# Patient Record
Sex: Female | Born: 1980 | Race: White | Hispanic: No | Marital: Married | Smoking: Current every day smoker
Health system: Southern US, Community
[De-identification: ages and names within clinical notes are randomized; demographics above are authoritative.]

## PROBLEM LIST (undated history)

## (undated) DIAGNOSIS — Z87442 Personal history of urinary calculi: Secondary | ICD-10-CM

## (undated) DIAGNOSIS — Z8619 Personal history of other infectious and parasitic diseases: Secondary | ICD-10-CM

## (undated) HISTORY — DX: Personal history of other infectious and parasitic diseases: Z86.19

## (undated) HISTORY — DX: Personal history of urinary calculi: Z87.442

## (undated) HISTORY — PX: LITHOTRIPSY: SUR834

---

## 2004-09-01 ENCOUNTER — Encounter: Payer: Self-pay | Admitting: General Practice

## 2009-03-28 ENCOUNTER — Encounter: Payer: Self-pay | Admitting: General Practice

## 2012-01-11 HISTORY — PX: TOTAL ABDOMINAL HYSTERECTOMY W/ BILATERAL SALPINGOOPHORECTOMY: SHX83

## 2012-02-22 ENCOUNTER — Encounter: Payer: Self-pay | Admitting: General Practice

## 2012-04-17 ENCOUNTER — Encounter: Payer: Self-pay | Admitting: General Practice

## 2012-05-23 ENCOUNTER — Encounter: Payer: Self-pay | Admitting: General Practice

## 2012-05-28 ENCOUNTER — Encounter: Payer: Self-pay | Admitting: General Practice

## 2013-01-10 HISTORY — PX: ABDOMINAL HYSTERECTOMY: SHX81

## 2013-04-03 ENCOUNTER — Encounter: Payer: Self-pay | Admitting: General Practice

## 2015-02-02 ENCOUNTER — Encounter: Payer: Self-pay | Admitting: General Practice

## 2016-11-29 ENCOUNTER — Encounter: Payer: Self-pay | Admitting: General Practice

## 2017-01-27 ENCOUNTER — Encounter: Payer: Self-pay | Admitting: General Practice

## 2018-11-08 ENCOUNTER — Other Ambulatory Visit: Payer: Self-pay

## 2018-11-08 ENCOUNTER — Ambulatory Visit (INDEPENDENT_AMBULATORY_CARE_PROVIDER_SITE_OTHER): Admitting: Physician Assistant

## 2018-11-08 ENCOUNTER — Encounter: Payer: Self-pay | Admitting: Physician Assistant

## 2018-11-08 VITALS — BP 104/70 | HR 78 | Temp 98.6°F | Resp 14 | Ht 63.0 in | Wt 159.0 lb

## 2018-11-08 DIAGNOSIS — F419 Anxiety disorder, unspecified: Secondary | ICD-10-CM | POA: Insufficient documentation

## 2018-11-08 DIAGNOSIS — M545 Low back pain, unspecified: Secondary | ICD-10-CM

## 2018-11-08 DIAGNOSIS — Z23 Encounter for immunization: Secondary | ICD-10-CM | POA: Diagnosis not present

## 2018-11-08 DIAGNOSIS — F411 Generalized anxiety disorder: Secondary | ICD-10-CM

## 2018-11-08 DIAGNOSIS — Z0001 Encounter for general adult medical examination with abnormal findings: Secondary | ICD-10-CM | POA: Diagnosis not present

## 2018-11-08 DIAGNOSIS — Z0000 Encounter for general adult medical examination without abnormal findings: Secondary | ICD-10-CM

## 2018-11-08 DIAGNOSIS — M797 Fibromyalgia: Secondary | ICD-10-CM

## 2018-11-08 LAB — CBC WITH DIFFERENTIAL/PLATELET
Basophils Absolute: 0.2 10*3/uL — ABNORMAL HIGH (ref 0.0–0.1)
Basophils Relative: 1.1 % (ref 0.0–3.0)
Eosinophils Absolute: 0.3 10*3/uL (ref 0.0–0.7)
Eosinophils Relative: 1.8 % (ref 0.0–5.0)
HCT: 45.6 % (ref 36.0–46.0)
Hemoglobin: 15.1 g/dL — ABNORMAL HIGH (ref 12.0–15.0)
Lymphocytes Relative: 34.7 % (ref 12.0–46.0)
Lymphs Abs: 5.4 10*3/uL — ABNORMAL HIGH (ref 0.7–4.0)
MCHC: 33.2 g/dL (ref 30.0–36.0)
MCV: 93.8 fl (ref 78.0–100.0)
Monocytes Absolute: 1.1 10*3/uL — ABNORMAL HIGH (ref 0.1–1.0)
Monocytes Relative: 7.3 % (ref 3.0–12.0)
Neutro Abs: 8.6 10*3/uL — ABNORMAL HIGH (ref 1.4–7.7)
Neutrophils Relative %: 55.1 % (ref 43.0–77.0)
Platelets: 292 10*3/uL (ref 150.0–400.0)
RBC: 4.86 Mil/uL (ref 3.87–5.11)
RDW: 13.4 % (ref 11.5–15.5)
WBC: 15.6 10*3/uL — ABNORMAL HIGH (ref 4.0–10.5)

## 2018-11-08 LAB — COMPREHENSIVE METABOLIC PANEL
ALT: 25 U/L (ref 0–35)
AST: 15 U/L (ref 0–37)
Albumin: 4.4 g/dL (ref 3.5–5.2)
Alkaline Phosphatase: 116 U/L (ref 39–117)
BUN: 12 mg/dL (ref 6–23)
CO2: 25 mEq/L (ref 19–32)
Calcium: 9.2 mg/dL (ref 8.4–10.5)
Chloride: 105 mEq/L (ref 96–112)
Creatinine, Ser: 0.67 mg/dL (ref 0.40–1.20)
GFR: 99.3 mL/min (ref >60.00)
Glucose, Bld: 78 mg/dL (ref 70–99)
Potassium: 4.2 mEq/L (ref 3.5–5.1)
Sodium: 139 mEq/L (ref 135–145)
Total Bilirubin: 0.4 mg/dL (ref 0.2–1.2)
Total Protein: 7 g/dL (ref 6.0–8.3)

## 2018-11-08 LAB — LIPID PANEL
Cholesterol: 228 mg/dL — ABNORMAL HIGH (ref 0–200)
HDL: 48.7 mg/dL (ref >39.00)
NonHDL: 179.6
Total CHOL/HDL Ratio: 5
Triglycerides: 333 mg/dL — ABNORMAL HIGH (ref 0.0–149.0)
VLDL: 66.6 mg/dL — ABNORMAL HIGH (ref 0.0–40.0)

## 2018-11-08 LAB — LDL CHOLESTEROL, DIRECT: Direct LDL: 140 mg/dL

## 2018-11-08 LAB — HEMOGLOBIN A1C: Hgb A1c MFr Bld: 5.6 % (ref 4.6–6.5)

## 2018-11-08 MED ORDER — CYCLOBENZAPRINE HCL 10 MG PO TABS: 10.0000 mg | ORAL_TABLET | Freq: Every day | ORAL | 0 refills | Status: DC

## 2018-11-08 MED ORDER — GABAPENTIN 300 MG PO CAPS: ORAL_CAPSULE | ORAL | 3 refills | Status: DC

## 2018-11-08 MED ORDER — ESTRADIOL 0.1 MG/GM VA CREA: TOPICAL_CREAM | VAGINAL | 1 refills | Status: DC

## 2018-11-08 NOTE — Progress Notes (Signed)
Patient presents to clinic today to establish care.  Acute Concerns: Patient endorses bilateral low back pain over the past few days after doing some heavy lifting while completing yard work at home. Notes pain worse with bending and other ROM. Denies radiation of pain. Has used heating pad and an old Flexeril with some improvement in symptoms.   Chronic Issues: GAD -- Followed by Psychiatry. Is currently on a regimen of Lexapro and Diazepam. Endorses taking as directed and overall doing very well. Has follow-up every 2 months at present.   Fibromyalgia -- Currently on a regimen of Gabapentin 600-900 mg nightly. Notes significant improvement/control in symptoms with this regimen. Is in need of refills.   Tobacco Use Disorder -- 1 ppd smoker. Wants to quit but is not ready at present. Denies diagnosis of COPD. Denies cough, chest tightness or wheezing.   Health Maintenance: Immunizations -- Agrees to flu shot today. Will obtain records to review prior immunizations.  PAP -- s/p hysterectomy and bilateral salpingectomy and oophorectomy  Past Medical History:  Diagnosis Date  . History of chickenpox     Past Surgical History:  Procedure Laterality Date  . ABDOMINAL HYSTERECTOMY  2015   No ovaries, no uterus  . CESAREAN SECTION  2014  . TOTAL ABDOMINAL HYSTERECTOMY W/ BILATERAL SALPINGOOPHORECTOMY  2014    Current Outpatient Medications on File Prior to Visit  Medication Sig Dispense Refill  . diazepam (VALIUM) 5 MG tablet Take 1-2 tablets by mouth twice daily as needed    . diphenhydrAMINE (BENADRYL) 25 MG tablet Take 1-2 tablets at bedtime for sleep    . escitalopram (LEXAPRO) 5 MG tablet Take 5 mg by mouth every morning.    . gabapentin (NEURONTIN) 300 MG capsule Take 300 mg by mouth. Take 2-3 capsules daily at bedtime    . Multiple Vitamin (MULTIVITAMIN) tablet Take 1 tablet by mouth daily.     No current facility-administered medications on file prior to visit.     No  Known Allergies  Family History  Problem Relation Age of Onset  . Cancer Mother 59       Lung  . COPD Mother   . Depression Mother   . Drug abuse Mother   . Early death Mother   . Heart disease Mother   . Pulmonary embolism Mother   . Cancer Father        Kidney  . Hyperlipidemia Father   . Hypertension Father   . Miscarriages / Stillbirths Sister   . Asthma Brother   . Alcohol abuse Maternal Grandmother   . Alcohol abuse Maternal Grandfather   . Cancer Paternal Grandmother        Breast  . COPD Paternal Grandmother   . Miscarriages / Stillbirths Paternal Grandmother   . Depression Paternal Grandfather   . Heart disease Paternal Grandfather   . Heart attack Paternal Grandfather   . Hypertension Paternal Grandfather   . Hearing loss Paternal Grandfather     Social History   Socioeconomic History  . Marital status: Married    Spouse name: Not on file  . Number of children: Not on file  . Years of education: Not on file  . Highest education level: Not on file  Occupational History  . Not on file  Social Needs  . Financial resource strain: Not on file  . Food insecurity    Worry: Not on file    Inability: Not on file  . Transportation needs    Medical: Not  on file    Non-medical: Not on file  Tobacco Use  . Smoking status: Current Every Day Smoker    Packs/day: 1.00    Types: Cigarettes  . Smokeless tobacco: Never Used  Substance and Sexual Activity  . Alcohol use: Not Currently  . Drug use: Not Currently  . Sexual activity: Yes    Birth control/protection: Surgical  Lifestyle  . Physical activity    Days per week: Not on file    Minutes per session: Not on file  . Stress: Not on file  Relationships  . Social Herbalist on phone: Not on file    Gets together: Not on file    Attends religious service: Not on file    Active member of club or organization: Not on file    Attends meetings of clubs or organizations: Not on file    Relationship  status: Not on file  . Intimate partner violence    Fear of current or ex partner: Not on file    Emotionally abused: Not on file    Physically abused: Not on file    Forced sexual activity: Not on file  Other Topics Concern  . Not on file  Social History Narrative  . Not on file   Review of Systems  Constitutional: Negative for fever.  Eyes: Negative for blurred vision and double vision.  Respiratory: Negative for shortness of breath.   Cardiovascular: Negative for chest pain and palpitations.  Gastrointestinal: Negative for abdominal pain, heartburn, nausea and vomiting.  Genitourinary: Negative for dysuria, frequency and urgency.  Musculoskeletal: Negative for myalgias (with chronic fibromyalgia -- no current flare.).  Psychiatric/Behavioral: Negative for depression, hallucinations, substance abuse and suicidal ideas. The patient is nervous/anxious. The patient does not have insomnia.    Resp 14   Ht 5\' 3"  (1.6 m)   Wt 159 lb (72.1 kg)   BMI 28.17 kg/m   Physical Exam Vitals signs reviewed.  Constitutional:      Appearance: Normal appearance.  HENT:     Head: Normocephalic and atraumatic.     Right Ear: Tympanic membrane normal.     Left Ear: Tympanic membrane normal.     Nose: Nose normal.  Neck:     Musculoskeletal: Neck supple.  Cardiovascular:     Rate and Rhythm: Normal rate and regular rhythm.     Pulses: Normal pulses.     Heart sounds: Normal heart sounds.  Pulmonary:     Effort: Pulmonary effort is normal.     Breath sounds: Normal breath sounds.  Musculoskeletal:     Lumbar back: She exhibits tenderness (bilateral lumbar perispinous musculature. Pain with lateral bending, flexion) and spasm. She exhibits no bony tenderness and no pain.  Neurological:     General: No focal deficit present.     Mental Status: She is alert and oriented to person, place, and time.    Assessment/Plan: 1. Visit for preventive health examination Depression screen negative.  Health Maintenance reviewed -- Flu shot today. No need for pap giving hysterectomy.  Preventive schedule discussed and handout given in AVS. Will obtain fasting labs today.  - CBC with Differential/Platelet - Comprehensive metabolic panel - TSH - Lipid panel - Hemoglobin A1c - B12 - Vitamin D (25 hydroxy)  2. Generalized anxiety disorder Continue management per Psychiatry. She has list of labs requested by specialist. Will order today and fax to her specialist.  - TSH - B12 - Vitamin D (25 hydroxy)  3. Fibromyalgia  Stable. Continue current regimen. Gabapentin refilled.   4. Acute bilateral low back pain without sciatica Mild. No alarm signs/symptoms. Rx Flexeril 10 mg to use at night time. Avoid heavy lifting or overexertion. Heating pad recommended. Alternate Tylenol and Ibuprofen if needed. Follow-up if symptoms are not continuing to resolve.   5. Need for immunization against influenza - Flu Vaccine QUAD 36+ mos IM    Leeanne Rio, PA-C

## 2018-11-08 NOTE — Assessment & Plan Note (Signed)
On regimen of Gabapentin 900 mg at night which help with this considerably. Continue current regimen. Will take over refills for her.

## 2018-11-08 NOTE — Assessment & Plan Note (Signed)
Followed by Psychiatry -- Sharon Seller DNP. Follow-up every other month. Is stable on current regimen. Continue care per specialist.

## 2018-11-08 NOTE — Patient Instructions (Signed)
Please go to the lab today for blood work.  I will call you with your results. We will alter treatment regimen(s) if indicated by your results.   Please continue care as directed by Psychiatry. Follow-up as scheduled. I will forward them your lab results via fax.   For the back pain, avoid heavy lifting or overexertion. Apply heating pad to the lower back for 10-15 minutes a few times per day. Flexeril as directed at night. Let me know if things aren't resolving.  Let me know when you are ready to quit smoking.  For the vaginal dryness and atrophy from menopause, please use the Estrace as directed. Let's follow-up in 4 weeks via video visit to reassess how symptoms are and make further adjustments to your regimen.   Your Gabapentin has been refilled.  It was very nice meeting you today. Welcome to [facility]!

## 2018-11-09 LAB — VITAMIN D 25 HYDROXY (VIT D DEFICIENCY, FRACTURES): VITD: 33.53 ng/mL (ref 30.00–100.00)

## 2018-11-09 LAB — TSH: TSH: 1.24 u[IU]/mL (ref 0.35–4.50)

## 2018-11-09 LAB — VITAMIN B12: Vitamin B-12: 226 pg/mL (ref 211–911)

## 2018-11-12 ENCOUNTER — Other Ambulatory Visit: Payer: Self-pay | Admitting: Emergency Medicine

## 2018-11-12 DIAGNOSIS — D72829 Elevated white blood cell count, unspecified: Secondary | ICD-10-CM

## 2018-11-26 ENCOUNTER — Ambulatory Visit

## 2018-11-27 ENCOUNTER — Ambulatory Visit (INDEPENDENT_AMBULATORY_CARE_PROVIDER_SITE_OTHER)

## 2018-11-27 ENCOUNTER — Other Ambulatory Visit: Payer: Self-pay

## 2018-11-27 DIAGNOSIS — D72829 Elevated white blood cell count, unspecified: Secondary | ICD-10-CM

## 2018-11-27 LAB — POCT URINALYSIS DIPSTICK
Bilirubin, UA: NEGATIVE
Blood, UA: NEGATIVE
Glucose, UA: NEGATIVE
Ketones, UA: NEGATIVE
Leukocytes, UA: NEGATIVE
Nitrite, UA: NEGATIVE
Protein, UA: NEGATIVE
Spec Grav, UA: 1.02 (ref 1.010–1.025)
Urobilinogen, UA: 0.2 E.U./dL
pH, UA: 6 (ref 5.0–8.0)

## 2018-11-27 LAB — CBC WITH DIFFERENTIAL/PLATELET
Basophils Absolute: 0 10*3/uL (ref 0.0–0.1)
Basophils Relative: 0.4 % (ref 0.0–3.0)
Eosinophils Absolute: 0.2 10*3/uL (ref 0.0–0.7)
Eosinophils Relative: 2.2 % (ref 0.0–5.0)
HCT: 45.7 % (ref 36.0–46.0)
Hemoglobin: 15.1 g/dL — ABNORMAL HIGH (ref 12.0–15.0)
Lymphocytes Relative: 43.8 % (ref 12.0–46.0)
Lymphs Abs: 4.9 10*3/uL — ABNORMAL HIGH (ref 0.7–4.0)
MCHC: 33 g/dL (ref 30.0–36.0)
MCV: 94.1 fl (ref 78.0–100.0)
Monocytes Absolute: 0.9 10*3/uL (ref 0.1–1.0)
Monocytes Relative: 7.8 % (ref 3.0–12.0)
Neutro Abs: 5.1 10*3/uL (ref 1.4–7.7)
Neutrophils Relative %: 45.8 % (ref 43.0–77.0)
Platelets: 294 10*3/uL (ref 150.0–400.0)
RBC: 4.86 Mil/uL (ref 3.87–5.11)
RDW: 13.4 % (ref 11.5–15.5)
WBC: 11.2 10*3/uL — ABNORMAL HIGH (ref 4.0–10.5)

## 2018-11-29 ENCOUNTER — Other Ambulatory Visit: Payer: Self-pay | Admitting: *Deleted

## 2018-11-29 DIAGNOSIS — D72829 Elevated white blood cell count, unspecified: Secondary | ICD-10-CM

## 2018-12-05 ENCOUNTER — Encounter: Payer: Self-pay | Admitting: Physician Assistant

## 2018-12-05 ENCOUNTER — Other Ambulatory Visit: Payer: Self-pay

## 2018-12-05 ENCOUNTER — Ambulatory Visit (INDEPENDENT_AMBULATORY_CARE_PROVIDER_SITE_OTHER): Admitting: Physician Assistant

## 2018-12-05 DIAGNOSIS — N951 Menopausal and female climacteric states: Secondary | ICD-10-CM

## 2018-12-05 NOTE — Progress Notes (Signed)
I have discussed the procedure for the virtual visit with the patient who has given consent to proceed with assessment and treatment.   Lindsay Ross S [name], CMA     

## 2018-12-05 NOTE — Progress Notes (Signed)
   Virtual Visit via Video   I connected with patient on 12/05/18 at 11:00 AM EST by a video enabled telemedicine application and verified that I am speaking with the correct person using two identifiers.  Location patient: Home Location provider: [facility] Office Persons participating in the virtual visit: Patient, Provider, [place] (Patina Moore)  I discussed the limitations of evaluation and management by telemedicine and the availability of in person appointments. The patient expressed understanding and agreed to proceed.  Subjective:   HPI:   Patient presents to clinic today for follow-up of vaginal dryness 2/2 surgically induced menopause. At last visit patient was started on premarin. Is taking as directed -- moved down to three times weekly then once weekly so far this week. Is tolerating well with significant improvement in vaginal dryness. No residual dyspareunia. Denies any new or worsening symptoms.   ROS:   See pertinent positives and negatives per HPI.  Patient Active Problem List   Diagnosis Date Noted  . Anxiety disorder 11/08/2018  . Fibromyalgia 11/08/2018    Social History   Tobacco Use  . Smoking status: Current Every Day Smoker    Packs/day: 1.00    Types: Cigarettes  . Smokeless tobacco: Never Used  Substance Use Topics  . Alcohol use: Never    Frequency: Never    Current Outpatient Medications:  .  cyclobenzaprine (FLEXERIL) 10 MG tablet, Take 1 tablet (10 mg total) by mouth at bedtime., Disp: 10 tablet, Rfl: 0 .  diazepam (VALIUM) 5 MG tablet, Take 1-2 tablets by mouth twice daily as needed, Disp: , Rfl:  .  diphenhydrAMINE (BENADRYL) 25 MG tablet, Take 1-2 tablets at bedtime for sleep, Disp: , Rfl:  .  escitalopram (LEXAPRO) 10 MG tablet, Take 10 mg by mouth every morning., Disp: , Rfl:  .  estradiol (ESTRACE VAGINAL) 0.1 MG/GM vaginal cream, Apply 1 applicator nightly for 2 weeks. Then decreased to 1 applicator 3 times weekly., Disp: 42.5  g, Rfl: 1 .  gabapentin (NEURONTIN) 300 MG capsule, Take 2-3 capsules daily at bedtime, Disp: 90 capsule, Rfl: 3 .  Multiple Vitamin (MULTIVITAMIN) tablet, Take 1 tablet by mouth daily., Disp: , Rfl:   No Known Allergies  Objective:   There were no vitals taken for this visit.  Patient is well-developed, well-nourished in no acute distress.  Resting comfortably at home.  Head is normocephalic, atraumatic.  No labored breathing.  Speech is clear and coherent with logical content.  Patient is alert and oriented at baseline.   Assessment and Plan:   1. Vaginal dryness, menopausal Much improved with Premarin. Continue current regimen. Has lab appt 2 weeks, will check BP at that time. Is s/p hysterectomy so endometrial thickening and cancer not a risk for her. Discussed she will need to stay up-to-date with routine pelvic and breast exams.     Leeanne Rio, PA-C 12/05/2018

## 2018-12-05 NOTE — Progress Notes (Deleted)
Patient presents to clinic today c/o ***.   Past Medical History:  Diagnosis Date   History of chickenpox    History of nephrolithiasis     Current Outpatient Medications on File Prior to Visit  Medication Sig Dispense Refill   cyclobenzaprine (FLEXERIL) 10 MG tablet Take 1 tablet (10 mg total) by mouth at bedtime. 10 tablet 0   diazepam (VALIUM) 5 MG tablet Take 1-2 tablets by mouth twice daily as needed     diphenhydrAMINE (BENADRYL) 25 MG tablet Take 1-2 tablets at bedtime for sleep     escitalopram (LEXAPRO) 10 MG tablet Take 10 mg by mouth every morning.     estradiol (ESTRACE VAGINAL) 0.1 MG/GM vaginal cream Apply 1 applicator nightly for 2 weeks. Then decreased to 1 applicator 3 times weekly. 42.5 g 1   gabapentin (NEURONTIN) 300 MG capsule Take 2-3 capsules daily at bedtime 90 capsule 3   Multiple Vitamin (MULTIVITAMIN) tablet Take 1 tablet by mouth daily.     No current facility-administered medications on file prior to visit.     No Known Allergies  Family History  Problem Relation Age of Onset   Cancer Mother 37       Lung   COPD Mother    Depression Mother    Drug abuse Mother    Early death Mother    Heart disease Mother    Pulmonary embolism Mother    Cancer Father        RCC   Hyperlipidemia Father    Hypertension Father    Miscarriages / Stillbirths Sister    Asthma Brother    Alcohol abuse Maternal Grandmother    Alcohol abuse Maternal Grandfather    Cancer Paternal Grandmother        Breast   COPD Paternal 34 / Stillbirths Paternal Grandmother    Depression Paternal Grandfather    Heart disease Paternal Grandfather    Heart attack Paternal Grandfather    Hypertension Paternal Grandfather    Hearing loss Paternal Grandfather     Social History   Socioeconomic History   Marital status: Married    Spouse name: Not on file   Number of children: Not on file   Years of education: Not  on file   Highest education level: Not on file  Occupational History   Not on file  Social Needs   Financial resource strain: Not on file   Food insecurity    Worry: Not on file    Inability: Not on file   Transportation needs    Medical: Not on file    Non-medical: Not on file  Tobacco Use   Smoking status: Current Every Day Smoker    Packs/day: 1.00    Types: Cigarettes   Smokeless tobacco: Never Used  Substance and Sexual Activity   Alcohol use: Never    Frequency: Never   Drug use: Not Currently   Sexual activity: Yes    Birth control/protection: Surgical  Lifestyle   Physical activity    Days per week: Not on file    Minutes per session: Not on file   Stress: Not on file  Relationships   Social connections    Talks on phone: Not on file    Gets together: Not on file    Attends religious service: Not on file    Active member of club or organization: Not on file    Attends meetings of clubs or organizations: Not on file  Relationship status: Not on file  Other Topics Concern   Not on file  Social History Narrative   Not on file    Review of Systems - See HPI.  All other ROS are negative.  There were no vitals taken for this visit.  Physical Exam  Recent Results (from the past 2160 hour(s))  CBC with Differential/Platelet     Status: Abnormal   Collection Time: 11/08/18 11:21 AM  Result Value Ref Range   WBC 15.6 (H) 4.0 - 10.5 K/uL   RBC 4.86 3.87 - 5.11 Mil/uL   Hemoglobin 15.1 (H) 12.0 - 15.0 g/dL   HCT 45.6 36.0 - 46.0 %   MCV 93.8 78.0 - 100.0 fl   MCHC 33.2 30.0 - 36.0 g/dL   RDW 13.4 11.5 - 15.5 %   Platelets 292.0 150.0 - 400.0 K/uL   Neutrophils Relative % 55.1 43.0 - 77.0 %   Lymphocytes Relative 34.7 12.0 - 46.0 %   Monocytes Relative 7.3 3.0 - 12.0 %   Eosinophils Relative 1.8 0.0 - 5.0 %   Basophils Relative 1.1 0.0 - 3.0 %   Neutro Abs 8.6 (H) 1.4 - 7.7 K/uL   Lymphs Abs 5.4 (H) 0.7 - 4.0 K/uL   Monocytes Absolute 1.1  (H) 0.1 - 1.0 K/uL   Eosinophils Absolute 0.3 0.0 - 0.7 K/uL   Basophils Absolute 0.2 (H) 0.0 - 0.1 K/uL  Comprehensive metabolic panel     Status: None   Collection Time: 11/08/18 11:21 AM  Result Value Ref Range   Sodium 139 135 - 145 mEq/L   Potassium 4.2 3.5 - 5.1 mEq/L   Chloride 105 96 - 112 mEq/L   CO2 25 19 - 32 mEq/L   Glucose, Bld 78 70 - 99 mg/dL   BUN 12 6 - 23 mg/dL   Creatinine, Ser 0.67 0.40 - 1.20 mg/dL   Total Bilirubin 0.4 0.2 - 1.2 mg/dL   Alkaline Phosphatase 116 39 - 117 U/L   AST 15 0 - 37 U/L   ALT 25 0 - 35 U/L   Total Protein 7.0 6.0 - 8.3 g/dL   Albumin 4.4 3.5 - 5.2 g/dL   Calcium 9.2 8.4 - 10.5 mg/dL   GFR 99.30 >60.00 mL/min  TSH     Status: None   Collection Time: 11/08/18 11:21 AM  Result Value Ref Range   TSH 1.24 0.35 - 4.50 uIU/mL  Lipid panel     Status: Abnormal   Collection Time: 11/08/18 11:21 AM  Result Value Ref Range   Cholesterol 228 (H) 0 - 200 mg/dL    Comment: ATP III Classification       Desirable:  < 200 mg/dL               Borderline High:  200 - 239 mg/dL          High:  > = 240 mg/dL   Triglycerides 333.0 (H) 0.0 - 149.0 mg/dL    Comment: Normal:  <150 mg/dLBorderline High:  150 - 199 mg/dL   HDL 48.70 >39.00 mg/dL   VLDL 66.6 (H) 0.0 - 40.0 mg/dL   Total CHOL/HDL Ratio 5     Comment:                Men          Women1/2 Average Risk     3.4          3.3Average Risk  5.0          4.42X Average Risk          9.6          7.13X Average Risk          15.0          11.0                       NonHDL 179.60     Comment: NOTE:  Non-HDL goal should be 30 mg/dL higher than patient's LDL goal (i.e. LDL goal of < 70 mg/dL, would have non-HDL goal of < 100 mg/dL)  Hemoglobin A1c     Status: None   Collection Time: 11/08/18 11:21 AM  Result Value Ref Range   Hgb A1c MFr Bld 5.6 4.6 - 6.5 %    Comment: Glycemic Control Guidelines for People with Diabetes:Non Diabetic:  <6%Goal of Therapy: <7%Additional Action Suggested:  >8%   B12      Status: None   Collection Time: 11/08/18 11:21 AM  Result Value Ref Range   Vitamin B-12 226 211 - 911 pg/mL  Vitamin D (25 hydroxy)     Status: None   Collection Time: 11/08/18 11:21 AM  Result Value Ref Range   VITD 33.53 30.00 - 100.00 ng/mL  LDL cholesterol, direct     Status: None   Collection Time: 11/08/18 11:21 AM  Result Value Ref Range   Direct LDL 140.0 mg/dL    Comment: Optimal:  <100 mg/dLNear or Above Optimal:  100-129 mg/dLBorderline High:  130-159 mg/dLHigh:  160-189 mg/dLVery High:  >190 mg/dL  CBC with Differential/Platelet     Status: Abnormal   Collection Time: 11/27/18  9:12 AM  Result Value Ref Range   WBC 11.2 (H) 4.0 - 10.5 K/uL   RBC 4.86 3.87 - 5.11 Mil/uL   Hemoglobin 15.1 (H) 12.0 - 15.0 g/dL   HCT 45.7 36.0 - 46.0 %   MCV 94.1 78.0 - 100.0 fl   MCHC 33.0 30.0 - 36.0 g/dL   RDW 13.4 11.5 - 15.5 %   Platelets 294.0 150.0 - 400.0 K/uL   Neutrophils Relative % 45.8 43.0 - 77.0 %   Lymphocytes Relative 43.8 12.0 - 46.0 %   Monocytes Relative 7.8 3.0 - 12.0 %   Eosinophils Relative 2.2 0.0 - 5.0 %   Basophils Relative 0.4 0.0 - 3.0 %   Neutro Abs 5.1 1.4 - 7.7 K/uL   Lymphs Abs 4.9 (H) 0.7 - 4.0 K/uL   Monocytes Absolute 0.9 0.1 - 1.0 K/uL   Eosinophils Absolute 0.2 0.0 - 0.7 K/uL   Basophils Absolute 0.0 0.0 - 0.1 K/uL  POCT urinalysis dipstick     Status: Normal   Collection Time: 11/27/18  9:35 AM  Result Value Ref Range   Color, UA yellow    Clarity, UA clear    Glucose, UA Negative Negative   Bilirubin, UA negative    Ketones, UA negative    Spec Grav, UA 1.020 1.010 - 1.025   Blood, UA negative    pH, UA 6.0 5.0 - 8.0   Protein, UA Negative Negative   Urobilinogen, UA 0.2 0.2 or 1.0 E.U./dL   Nitrite, UA negative    Leukocytes, UA Negative Negative   Appearance     Odor      Assessment/Plan: No problem-specific Assessment & Plan notes found for this encounter.    Leeanne Rio, PA-C

## 2018-12-19 ENCOUNTER — Ambulatory Visit (INDEPENDENT_AMBULATORY_CARE_PROVIDER_SITE_OTHER)

## 2018-12-19 ENCOUNTER — Other Ambulatory Visit: Payer: Self-pay

## 2018-12-19 DIAGNOSIS — D72829 Elevated white blood cell count, unspecified: Secondary | ICD-10-CM

## 2018-12-19 LAB — CBC WITH DIFFERENTIAL/PLATELET
Basophils Absolute: 0.1 10*3/uL (ref 0.0–0.1)
Basophils Relative: 0.5 % (ref 0.0–3.0)
Eosinophils Absolute: 0.2 10*3/uL (ref 0.0–0.7)
Eosinophils Relative: 1.4 % (ref 0.0–5.0)
HCT: 46 % (ref 36.0–46.0)
Hemoglobin: 15 g/dL (ref 12.0–15.0)
Lymphocytes Relative: 38.4 % (ref 12.0–46.0)
Lymphs Abs: 5.5 10*3/uL — ABNORMAL HIGH (ref 0.7–4.0)
MCHC: 32.6 g/dL (ref 30.0–36.0)
MCV: 95.1 fl (ref 78.0–100.0)
Monocytes Absolute: 0.9 10*3/uL (ref 0.1–1.0)
Monocytes Relative: 6.1 % (ref 3.0–12.0)
Neutro Abs: 7.6 10*3/uL (ref 1.4–7.7)
Neutrophils Relative %: 53.6 % (ref 43.0–77.0)
Platelets: 295 10*3/uL (ref 150.0–400.0)
RBC: 4.83 Mil/uL (ref 3.87–5.11)
RDW: 13.3 % (ref 11.5–15.5)
WBC: 14.2 10*3/uL — ABNORMAL HIGH (ref 4.0–10.5)

## 2018-12-20 ENCOUNTER — Telehealth: Payer: Self-pay | Admitting: Physician Assistant

## 2018-12-20 ENCOUNTER — Ambulatory Visit

## 2018-12-20 ENCOUNTER — Other Ambulatory Visit: Payer: Self-pay | Admitting: Emergency Medicine

## 2018-12-20 DIAGNOSIS — D72829 Elevated white blood cell count, unspecified: Secondary | ICD-10-CM

## 2018-12-20 NOTE — Telephone Encounter (Signed)
Called patient back but left another message on VM for lab results.

## 2018-12-20 NOTE — Telephone Encounter (Signed)
Pt returned phone call back lab results.  

## 2018-12-21 ENCOUNTER — Telehealth: Payer: Self-pay | Admitting: Hematology and Oncology

## 2018-12-21 NOTE — Telephone Encounter (Signed)
A new hem appt has been scheduled for Lindsay Ross to see Dr Lorenso Courier on 12/21 at [place]. Pt has been made aware to arrive 15 minutes early.

## 2018-12-30 NOTE — Progress Notes (Signed)
[place] [phone]   Fax:([phone] [place] NOTE  Patient Care Team: Delorse Limber as PCP - General (Family Medicine)  Hematological/Oncological History  #Leukocytosis 1) 11/08/2018: WBC 15.6, Hgb 15.1, Plt 292. Neutrophills 8600, Lymphocytes 5400 2) 11/27/2018: WBC 11.2, Hgb 15.1, Plt 294. Neutrophils 5100, Lymphocytes 4900 3) 12/19/2018: WBC 14.2, Hgb 15.0, Plt 295. Neutrophils 7600, Lymphocytes 5500 4) 12/31/2018: establish care with Dr. Lorenso Courier  CHIEF COMPLAINTS/PURPOSE OF CONSULTATION:  Leukocytosis  HISTORY OF PRESENTING ILLNESS:  Lindsay Ross 38 y.o. female with no significant past medical history who presents for evaluation of leukocytosis.   On review of prior records, Lindsay Ross has been noted to have a mild leukcytosis since at least Oct 2020 per [facility] records. This has been a neutrophilic predominance with a mild polycythemia and normal platelets. The husband provided records from their time in [place] which showed leukocytosis dating back to at least August 2015 (WBC 13.6) and other records from Feb 2018 (12.6) and Nov 2018 (WBC 20.8).   On exam today Lindsay Ross notes that she feels well.  She denies any fevers, chills, sweats, nausea, vomiting, or diarrhea.  She reports that she had weight loss approximately 6 months ago while on the keto diet and dropped 10 to 15 pounds, however she is already gained 5 pounds of this back after discontinuing the diet.  She has had no symptoms concerning for urinary tract infection or yeast infection.  She does have a history of severe anxiety and fibromyalgia for which she is taking gabapentin and Lexapro.  She does report occasionally having a dry throat in the morning, however no rhinorrhea or sore throat.  She reports that she has not noticed any lymphadenopathy or any bumps or lumps around her neck or groin area.  A full 10 point ROS is listed below.  On further discussion she does have a  history of endometriosis for which she underwent hysterectomy in 2014.  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  She is an active smoker and has been smoking since the age of 29 at 1 to 2 packs/day.  She does have a family history for malignancy with her mother passing away of lung cancer and her father having nephrectomy due to kidney cancer.  Her paternal grandmother also had breast cancer.  MEDICAL HISTORY:  Past Medical History:  Diagnosis Date  . History of chickenpox   . History of nephrolithiasis     SURGICAL HISTORY: Past Surgical History:  Procedure Laterality Date  . ABDOMINAL HYSTERECTOMY  2015   No ovaries, no uterus  . CESAREAN SECTION  2014  . TOTAL ABDOMINAL HYSTERECTOMY W/ BILATERAL SALPINGOOPHORECTOMY  2014    SOCIAL HISTORY: Social History   Socioeconomic History  . Marital status: Married    Spouse name: Not on file  . Number of children: Not on file  . Years of education: Not on file  . Highest education level: Not on file  Occupational History  . Not on file  Tobacco Use  . Smoking status: Current Every Day Smoker    Packs/day: 1.00    Types: Cigarettes  . Smokeless tobacco: Never Used  Substance and Sexual Activity  . Alcohol use: Never  . Drug use: Not Currently  . Sexual activity: Yes    Birth control/protection: Surgical  Other Topics Concern  . Not on file  Social History Narrative  . Not on file  Social Determinants of Health   Financial Resource Strain:   . Difficulty of Paying Living Expenses: Not on file  Food Insecurity:   . Worried About Charity fundraiser in the Last Year: Not on file  . Ran Out of Food in the Last Year: Not on file  Transportation Needs:   . Lack of Transportation (Medical): Not on file  . Lack of Transportation (Non-Medical): Not on file  Physical Activity:   . Days of Exercise per Week: Not on file  . Minutes of Exercise per Session: Not on file  Stress:   .  Feeling of Stress : Not on file  Social Connections:   . Frequency of Communication with Friends and Family: Not on file  . Frequency of Social Gatherings with Friends and Family: Not on file  . Attends Religious Services: Not on file  . Active Member of Clubs or Organizations: Not on file  . Attends Archivist Meetings: Not on file  . Marital Status: Not on file  Intimate Partner Violence:   . Fear of Current or Ex-Partner: Not on file  . Emotionally Abused: Not on file  . Physically Abused: Not on file  . Sexually Abused: Not on file    FAMILY HISTORY: Family History  Problem Relation Age of Onset  . Cancer Mother 75       Lung  . COPD Mother   . Depression Mother   . Drug abuse Mother   . Early death Mother   . Heart disease Mother   . Pulmonary embolism Mother   . Cancer Father        RCC  . Hyperlipidemia Father   . Hypertension Father   . Miscarriages / Stillbirths Sister   . Asthma Brother   . Alcohol abuse Maternal Grandmother   . Alcohol abuse Maternal Grandfather   . Cancer Paternal Grandmother        Breast  . COPD Paternal Grandmother   . Miscarriages / Stillbirths Paternal Grandmother   . Depression Paternal Grandfather   . Heart disease Paternal Grandfather   . Heart attack Paternal Grandfather   . Hypertension Paternal Grandfather   . Hearing loss Paternal Grandfather     ALLERGIES:  has No Known Allergies.  MEDICATIONS:  Current Outpatient Medications  Medication Sig Dispense Refill  . cyclobenzaprine (FLEXERIL) 10 MG tablet Take 1 tablet (10 mg total) by mouth at bedtime. (Patient not taking: Reported on 12/31/2018) 10 tablet 0  . diazepam (VALIUM) 5 MG tablet Take 1-2 tablets by mouth twice daily as needed    . diphenhydrAMINE (BENADRYL) 25 MG tablet Take 1-2 tablets at bedtime for sleep    . escitalopram (LEXAPRO) 10 MG tablet Take 10 mg by mouth every morning.    Marland Kitchen estradiol (ESTRACE VAGINAL) 0.1 MG/GM vaginal cream Apply 1  applicator nightly for 2 weeks. Then decreased to 1 applicator 3 times weekly. 42.5 g 1  . gabapentin (NEURONTIN) 300 MG capsule Take 2-3 capsules daily at bedtime 90 capsule 3  . Multiple Vitamin (MULTIVITAMIN) tablet Take 1 tablet by mouth daily.     No current facility-administered medications for this visit.    REVIEW OF SYSTEMS:   Constitutional: ( - ) fevers, ( - )  chills , ( - ) night sweats Eyes: ( - ) blurriness of vision, ( - ) double vision, ( - ) watery eyes Ears, nose, mouth, throat, and face: ( - ) mucositis, ( - ) sore throat Respiratory: ( - )  cough, ( - ) dyspnea, ( - ) wheezes Cardiovascular: ( - ) palpitation, ( - ) chest discomfort, ( - ) lower extremity swelling Gastrointestinal:  ( - ) nausea, ( - ) heartburn, ( - ) change in bowel habits Skin: ( - ) abnormal skin rashes Lymphatics: ( - ) new lymphadenopathy, ( - ) easy bruising Neurological: ( - ) numbness, ( - ) tingling, ( - ) new weaknesses Behavioral/Psych: ( - ) mood change, ( - ) new changes  All other systems were reviewed with the patient and are negative.  PHYSICAL EXAMINATION: ECOG PERFORMANCE STATUS: 0 - Asymptomatic  Vitals:   12/31/18 0858  BP: 125/82  Pulse: 64  Resp: 18  Temp: 98.3 F (36.8 C)  SpO2: 99%   Filed Weights   12/31/18 0858  Weight: 158 lb 6.4 oz (71.8 kg)    GENERAL: well appearing middle aged Caucasian female in NAD  SKIN: skin color, texture, turgor are normal, no rashes or significant lesions EYES: conjunctiva are pink and non-injected, sclera clear OROPHARYNX: no exudate, no erythema; lips, buccal mucosa, and tongue normal  NECK: supple, non-tender LYMPH:  no palpable lymphadenopathy in the cervical, axillary or supraclavicular LUNGS: clear to auscultation and percussion with normal breathing effort HEART: regular rate & rhythm and no murmurs and no lower extremity edema ABDOMEN: soft, non-tender, non-distended, normal bowel sounds Musculoskeletal: no cyanosis of  digits and no clubbing  PSYCH: alert & oriented x 3, fluent speech NEURO: no focal motor/sensory deficits  LABORATORY DATA:  I have reviewed the data as listed Lab Results  Component Value Date   WBC 15.4 (H) 12/31/2018   HGB 15.3 (H) 12/31/2018   HCT 47.6 (H) 12/31/2018   MCV 96.4 12/31/2018   PLT 264 12/31/2018   NEUTROABS 8.1 (H) 12/31/2018   Lab Results  Component Value Date   WBC 15.4 (H) 12/31/2018   WBC 14.2 (H) 12/19/2018   WBC 11.2 (H) 11/27/2018   WBC 15.6 (H) 11/08/2018     PATHOLOGY: None to review.   BLOOD FILM:  Review of the peripheral blood smear showed normal appearing [place] cells with neutrophils that were appropriately lobated and granulated. There was no predominance of bi-lobed or hyper-segmented neutrophils appreciated. No Dohle bodies were noted. There was no left shifting, immature forms or blasts noted. Increased reactive lymphocytes, but otherwise lymphocytes normal in size without any predominance of large granular lymphocytes. Red cells show no anisopoikilocytosis, macrocytes , microcytes or polychromasia. There were no schistocytes, target cells, echinocytes, acanthocytes, dacrocytes, or stomatocytes.There was no rouleaux formation, nucleated red cells, or intra-cellular inclusions noted. The platelets are normal in size, shape, and color without any clumping evident.  RADIOGRAPHIC STUDIES: None to review. No results found.  ASSESSMENT & PLAN Lindsay Ross 38 y.o. female with no significant past medical history who presents for evaluation of leukocytosis.    The differential for neutrophilic leukocytosis is broad and includes inflammation, infection, medication side effect, and less common hematological malignancy.  As of inflammation can include smoking.  Given the chronicity of her findings infection malignancy are far less likely.  Additionally she is not currently taking any medications that are consistent with these findings.  After review the  labs and discussion with the patient her findings are most consistent with leukocytosis secondary to smoking.  She has records dating back to at least 2015 showing that she has this neutrophilic predominant leukocytosis.  She has no other clear inflammatory conditions and n and no current symptoms that  are concerning for infection.  As such the most likely etiology would be her extensive smoking history of 1-2 ppd.  We do recommend smoking cessation at this time, but will undergo full evaluation for this leukocytosis to assure that there are no other possible etiologies.  #Leukocytosis, Neutrophilic Predominance #Increased Hgb -- will order CBC, CMP, ESR, and CRP today. Additionally will collect a peripheral blood film for review. --will check an erythropoietin today to determine the etiology. Once again, the most likely etiology is smoking. --encourage smoking cessation, provided resources to help with this. Can discuss with PCP regarding patches vs medications to assist in quiting.  --RTC in 6 months to assure no further issues with her blood counts.   Orders Placed This Encounter  Procedures  . CBC with Differential (Cancer Center Only)    Standing Status:   Future    Number of Occurrences:   1    Standing Expiration Date:   12/31/2019  . CMP ([place] only)    Standing Status:   Future    Number of Occurrences:   1    Standing Expiration Date:   12/31/2019  . Save Smear ([facility])    Standing Status:   Future    Number of Occurrences:   1    Standing Expiration Date:   12/31/2019  . TSH    Standing Status:   Future    Number of Occurrences:   1    Standing Expiration Date:   12/31/2019  . Sedimentation rate    Standing Status:   Future    Number of Occurrences:   1    Standing Expiration Date:   12/31/2019  . Erythropoietin    Standing Status:   Future    Number of Occurrences:   1    Standing Expiration Date:   12/31/2019  . C-reactive protein    Standing Status:   Future     Number of Occurrences:   1    Standing Expiration Date:   12/31/2019    All questions were answered. The patient knows to call the clinic with any problems, questions or concerns.  A total of more than 45 minutes were spent on this encounter and over half of that time was spent on counseling and coordination of care as outlined above.   Ledell Peoples, MD Department of Hematology/Oncology [place] [facility] Phone: [phone] Pager: [phone] Email: [email or web address]  12/31/2018 10:12 AM

## 2018-12-31 ENCOUNTER — Other Ambulatory Visit: Payer: Self-pay

## 2018-12-31 ENCOUNTER — Inpatient Hospital Stay: Attending: Hematology and Oncology | Admitting: Hematology and Oncology

## 2018-12-31 ENCOUNTER — Inpatient Hospital Stay

## 2018-12-31 VITALS — BP 125/82 | HR 64 | Temp 98.3°F | Resp 18 | Ht 64.0 in | Wt 158.4 lb

## 2018-12-31 DIAGNOSIS — Z87442 Personal history of urinary calculi: Secondary | ICD-10-CM

## 2018-12-31 DIAGNOSIS — Z801 Family history of malignant neoplasm of trachea, bronchus and lung: Secondary | ICD-10-CM | POA: Diagnosis not present

## 2018-12-31 DIAGNOSIS — Z83438 Family history of other disorder of lipoprotein metabolism and other lipidemia: Secondary | ICD-10-CM | POA: Diagnosis not present

## 2018-12-31 DIAGNOSIS — N809 Endometriosis, unspecified: Secondary | ICD-10-CM | POA: Diagnosis not present

## 2018-12-31 DIAGNOSIS — Z814 Family history of other substance abuse and dependence: Secondary | ICD-10-CM | POA: Diagnosis not present

## 2018-12-31 DIAGNOSIS — Z8249 Family history of ischemic heart disease and other diseases of the circulatory system: Secondary | ICD-10-CM | POA: Diagnosis not present

## 2018-12-31 DIAGNOSIS — F17210 Nicotine dependence, cigarettes, uncomplicated: Secondary | ICD-10-CM | POA: Insufficient documentation

## 2018-12-31 DIAGNOSIS — Z79899 Other long term (current) drug therapy: Secondary | ICD-10-CM | POA: Insufficient documentation

## 2018-12-31 DIAGNOSIS — Z836 Family history of other diseases of the respiratory system: Secondary | ICD-10-CM | POA: Diagnosis not present

## 2018-12-31 DIAGNOSIS — Z803 Family history of malignant neoplasm of breast: Secondary | ICD-10-CM | POA: Diagnosis not present

## 2018-12-31 DIAGNOSIS — Z8051 Family history of malignant neoplasm of kidney: Secondary | ICD-10-CM | POA: Diagnosis not present

## 2018-12-31 DIAGNOSIS — D72829 Elevated white blood cell count, unspecified: Secondary | ICD-10-CM | POA: Insufficient documentation

## 2018-12-31 DIAGNOSIS — Z822 Family history of deafness and hearing loss: Secondary | ICD-10-CM | POA: Insufficient documentation

## 2018-12-31 DIAGNOSIS — M797 Fibromyalgia: Secondary | ICD-10-CM

## 2018-12-31 DIAGNOSIS — Z811 Family history of alcohol abuse and dependence: Secondary | ICD-10-CM | POA: Insufficient documentation

## 2018-12-31 DIAGNOSIS — F419 Anxiety disorder, unspecified: Secondary | ICD-10-CM | POA: Diagnosis not present

## 2018-12-31 DIAGNOSIS — Z90722 Acquired absence of ovaries, bilateral: Secondary | ICD-10-CM | POA: Diagnosis not present

## 2018-12-31 DIAGNOSIS — Z818 Family history of other mental and behavioral disorders: Secondary | ICD-10-CM

## 2018-12-31 DIAGNOSIS — D72828 Other elevated white blood cell count: Secondary | ICD-10-CM

## 2018-12-31 LAB — CMP (CANCER CENTER ONLY)
ALT: 29 U/L (ref 0–44)
AST: 18 U/L (ref 15–41)
Albumin: 4.3 g/dL (ref 3.5–5.0)
Alkaline Phosphatase: 118 U/L (ref 38–126)
Anion gap: 12 (ref 5–15)
BUN: 15 mg/dL (ref 6–20)
CO2: 22 mmol/L (ref 22–32)
Calcium: 9.4 mg/dL (ref 8.9–10.3)
Chloride: 107 mmol/L (ref 98–111)
Creatinine: 0.76 mg/dL (ref 0.44–1.00)
GFR, Est AFR Am: >60 mL/min (ref >60)
GFR, Estimated: >60 mL/min (ref >60)
Glucose, Bld: 93 mg/dL (ref 70–99)
Potassium: 4 mmol/L (ref 3.5–5.1)
Sodium: 141 mmol/L (ref 135–145)
Total Bilirubin: 0.3 mg/dL (ref 0.3–1.2)
Total Protein: 8 g/dL (ref 6.5–8.1)

## 2018-12-31 LAB — CBC WITH DIFFERENTIAL (CANCER CENTER ONLY)
Abs Immature Granulocytes: 0.05 10*3/uL (ref 0.00–0.07)
Basophils Absolute: 0.1 10*3/uL (ref 0.0–0.1)
Basophils Relative: 0 %
Eosinophils Absolute: 0.2 10*3/uL (ref 0.0–0.5)
Eosinophils Relative: 2 %
HCT: 47.6 % — ABNORMAL HIGH (ref 36.0–46.0)
Hemoglobin: 15.3 g/dL — ABNORMAL HIGH (ref 12.0–15.0)
Immature Granulocytes: 0 %
Lymphocytes Relative: 39 %
Lymphs Abs: 5.9 10*3/uL — ABNORMAL HIGH (ref 0.7–4.0)
MCH: 31 pg (ref 26.0–34.0)
MCHC: 32.1 g/dL (ref 30.0–36.0)
MCV: 96.4 fL (ref 80.0–100.0)
Monocytes Absolute: 1 10*3/uL (ref 0.1–1.0)
Monocytes Relative: 7 %
Neutro Abs: 8.1 10*3/uL — ABNORMAL HIGH (ref 1.7–7.7)
Neutrophils Relative %: 52 %
Platelet Count: 264 10*3/uL (ref 150–400)
RBC: 4.94 MIL/uL (ref 3.87–5.11)
RDW: 13.1 % (ref 11.5–15.5)
WBC Count: 15.4 10*3/uL — ABNORMAL HIGH (ref 4.0–10.5)
nRBC: 0 % (ref 0.0–0.2)

## 2018-12-31 LAB — C-REACTIVE PROTEIN: CRP: 0.8 mg/dL (ref <1.0)

## 2018-12-31 LAB — SAVE SMEAR(SSMR), FOR PROVIDER SLIDE REVIEW

## 2018-12-31 LAB — TSH: TSH: 1.534 u[IU]/mL (ref 0.308–3.960)

## 2018-12-31 LAB — SEDIMENTATION RATE: Sed Rate: 4 mm/hr (ref 0–22)

## 2019-01-01 LAB — ERYTHROPOIETIN: Erythropoietin: 9.6 m[IU]/mL (ref 2.6–18.5)

## 2019-03-21 ENCOUNTER — Telehealth: Payer: Self-pay | Admitting: Physician Assistant

## 2019-03-21 DIAGNOSIS — M797 Fibromyalgia: Secondary | ICD-10-CM

## 2019-03-21 MED ORDER — GABAPENTIN 300 MG PO CAPS: ORAL_CAPSULE | ORAL | 3 refills | Status: DC

## 2019-03-21 NOTE — Telephone Encounter (Signed)
Pt called in asking for a refill on her gabapentin, please advise

## 2019-03-21 NOTE — Telephone Encounter (Signed)
Patient advised of Gabapentin refilled to the pharmacy.

## 2019-04-05 ENCOUNTER — Ambulatory Visit: Attending: Internal Medicine

## 2019-04-05 DIAGNOSIS — Z23 Encounter for immunization: Secondary | ICD-10-CM

## 2019-04-05 NOTE — Progress Notes (Signed)
   Covid-19 Vaccination Clinic  Name:  Georgett Depa    MRN: [id number] DOB: 05-Dec-1980  04/05/2019  Ms. Lindroth was observed post Covid-19 immunization for 15 minutes without incident. She was provided with Vaccine Information Sheet and instruction to access the V-Safe system.   Ms. Flakes was instructed to call 911 with any severe reactions post vaccine: Marland Kitchen Difficulty breathing  . Swelling of face and throat  . A fast heartbeat  . A bad rash all over body  . Dizziness and weakness   Immunizations Administered    Name Date Dose VIS Date Route   Moderna COVID-19 Vaccine 04/05/2019  9:23 AM 0.5 mL 12/11/2018 Intramuscular   Manufacturer: Moderna   Lot: [id number]   [id number]

## 2019-05-07 ENCOUNTER — Ambulatory Visit: Attending: Internal Medicine

## 2019-05-07 DIAGNOSIS — Z23 Encounter for immunization: Secondary | ICD-10-CM

## 2019-05-07 NOTE — Progress Notes (Signed)
   Covid-19 Vaccination Clinic  Name:  Bethanny Eick    MRN: [id number] DOB: [birth year]  05/07/2019  Ms. Baldwin was observed post Covid-19 immunization for 15 minutes without incident. She was provided with Vaccine Information Sheet and instruction to access the V-Safe system.   Ms. Thackston was instructed to call 911 with any severe reactions post vaccine: Marland Kitchen Difficulty breathing  . Swelling of face and throat  . A fast heartbeat  . A bad rash all over body  . Dizziness and weakness   Immunizations Administered    Name Date Dose VIS Date Route   Moderna COVID-19 Vaccine 05/07/2019 10:41 AM 0.5 mL 12/2018 Intramuscular   Manufacturer: Moderna   Lot: [id number]   [id number]

## 2019-07-29 ENCOUNTER — Telehealth: Payer: Self-pay | Admitting: Physician Assistant

## 2019-07-29 NOTE — Telephone Encounter (Signed)
She is overdue for follow-up. Needs to schedule appointment for further refills.

## 2019-07-29 NOTE — Telephone Encounter (Signed)
Patient needs gabapentin refilled - Please send to [facility] in [place]. Last visit 11/27/18 - No upcoming appts

## 2019-07-31 ENCOUNTER — Other Ambulatory Visit: Payer: Self-pay

## 2019-07-31 ENCOUNTER — Telehealth (INDEPENDENT_AMBULATORY_CARE_PROVIDER_SITE_OTHER): Admitting: Physician Assistant

## 2019-07-31 ENCOUNTER — Encounter: Payer: Self-pay | Admitting: Physician Assistant

## 2019-07-31 DIAGNOSIS — M797 Fibromyalgia: Secondary | ICD-10-CM

## 2019-07-31 MED ORDER — GABAPENTIN 300 MG PO CAPS: ORAL_CAPSULE | ORAL | 1 refills | Status: DC

## 2019-07-31 NOTE — Progress Notes (Signed)
I have discussed the procedure for the virtual visit with the patient who has given consent to proceed with assessment and treatment.   Yeng Frankie S Jaslen Adcox, CMA     

## 2019-07-31 NOTE — Progress Notes (Signed)
   Virtual Visit via Video   I connected with patient on 07/31/19 at 11:00 AM EDT by a video enabled telemedicine application and verified that I am speaking with the correct person using two identifiers.  Location patient: Home Location provider: [facility], Office Persons participating in the virtual visit: Patient, Provider, [place] (Patina Moore)  I discussed the limitations of evaluation and management by telemedicine and the availability of in person appointments. The patient expressed understanding and agreed to proceed.  Subjective:   HPI:   Patient presents via [place] today for follow-up of fibromyalgia. Is currently on a regimen of Gabapentin 900 mg later afternoon/evening. Notes this controls symptoms very well, calming down the pain the legs and arms. This lets her sleep well at night. Sometimes will have breakthrough symptoms around midday, especially in the arms. Notes Psychiatry has her anxiety well-controlled overall. Has almost quit smoking which has helped her fibromyalgia symptoms.   ROS:   See pertinent positives and negatives per HPI.  Patient Active Problem List   Diagnosis Date Noted  . Anxiety disorder 11/08/2018  . Fibromyalgia 11/08/2018    Social History   Tobacco Use  . Smoking status: Current Every Day Smoker    Packs/day: 0.50    Types: Cigarettes  . Smokeless tobacco: Never Used  . Tobacco comment: 10-15 cigs per day  Substance Use Topics  . Alcohol use: Never    Current Outpatient Medications:  .  diazepam (VALIUM) 5 MG tablet, Take 1-2 tablets by mouth twice daily as needed, Disp: , Rfl:  .  diphenhydrAMINE (BENADRYL) 25 MG tablet, Take 1-2 tablets at bedtime for sleep, Disp: , Rfl:  .  escitalopram (LEXAPRO) 20 MG tablet, Take 20 mg by mouth daily., Disp: , Rfl:  .  estradiol (ESTRACE VAGINAL) 0.1 MG/GM vaginal cream, Apply 1 applicator nightly for 2 weeks. Then decreased to 1 applicator 3 times weekly., Disp: 42.5 g, Rfl: 1 .   gabapentin (NEURONTIN) 300 MG capsule, Take 1 capsule by mouth at noon. Take 3 capsules by mouth in the evening., Disp: 120 capsule, Rfl: 1 .  Multiple Vitamin (MULTIVITAMIN) tablet, Take 1 tablet by mouth daily., Disp: , Rfl:   No Known Allergies  Objective:   There were no vitals taken for this visit.  Patient is well-developed, well-nourished in no acute distress.  Resting comfortably at home.  Head is normocephalic, atraumatic.  No labored breathing.  Speech is clear and coherent with logical content.  Patient is alert and oriented at baseline.   Assessment and Plan:   1. Fibromyalgia Some breakthrough daytime symptoms. Nighttime symptoms are well controlled. Supportive measures reviewed. Will add on 300 mg midday dose of Gabapentin and continue 900 mg each evening. Follow-up discussed.    Leeanne Rio, PA-C 07/31/2019

## 2019-08-28 ENCOUNTER — Encounter: Payer: Self-pay | Admitting: Family Medicine

## 2019-08-28 ENCOUNTER — Other Ambulatory Visit: Payer: Self-pay

## 2019-08-28 ENCOUNTER — Ambulatory Visit (INDEPENDENT_AMBULATORY_CARE_PROVIDER_SITE_OTHER): Admitting: Family Medicine

## 2019-08-28 VITALS — BP 102/58 | HR 91 | Temp 98.3°F | Ht 64.0 in | Wt 177.0 lb

## 2019-08-28 DIAGNOSIS — N3000 Acute cystitis without hematuria: Secondary | ICD-10-CM | POA: Diagnosis not present

## 2019-08-28 LAB — POCT URINALYSIS DIPSTICK
Bilirubin, UA: NEGATIVE
Glucose, UA: NEGATIVE
Ketones, UA: NEGATIVE
Leukocytes, UA: NEGATIVE
Nitrite, UA: NEGATIVE
Protein, UA: POSITIVE — AB
Spec Grav, UA: 1.025 (ref 1.010–1.025)
Urobilinogen, UA: 0.2 E.U./dL
pH, UA: 6 (ref 5.0–8.0)

## 2019-08-28 MED ORDER — SULFAMETHOXAZOLE-TRIMETHOPRIM 800-160 MG PO TABS
1.0000 | ORAL_TABLET | Freq: Two times a day (BID) | ORAL | 0 refills | Status: DC
Start: 2019-08-28 — End: 2020-05-08

## 2019-08-28 MED ORDER — PHENAZOPYRIDINE HCL 100 MG PO TABS
100.0000 mg | ORAL_TABLET | Freq: Three times a day (TID) | ORAL | 0 refills | Status: DC | PRN
Start: 2019-08-28 — End: 2020-05-08

## 2019-08-28 NOTE — Patient Instructions (Signed)
For pyridium-can use for pain, but do not use past 2 days.  Bactrim: 1 pill twice a day for 5 days.  Make sure and drink plenty of water!  Nice to meet you! D.r Rogers Blocker   Urinary Tract Infection, Adult A urinary tract infection (UTI) is an infection of any part of the urinary tract. The urinary tract includes:  The kidneys.  The ureters.  The bladder.  The urethra. These organs make, store, and get rid of pee (urine) in the body. What are the causes? This is caused by germs (bacteria) in your genital area. These germs grow and cause swelling (inflammation) of your urinary tract. What increases the risk? You are more likely to develop this condition if:  You have a small, thin tube (catheter) to drain pee.  You cannot control when you pee or poop (incontinence).  You are female, and: ? You use these methods to prevent pregnancy:  A medicine that kills sperm (spermicide).  A device that blocks sperm (diaphragm). ? You have low levels of a female hormone (estrogen). ? You are pregnant.  You have genes that add to your risk.  You are sexually active.  You take antibiotic medicines.  You have trouble peeing because of: ? A prostate that is bigger than normal, if you are female. ? A blockage in the part of your body that drains pee from the bladder (urethra). ? A kidney stone. ? A nerve condition that affects your bladder (neurogenic bladder). ? Not getting enough to drink. ? Not peeing often enough.  You have other conditions, such as: ? Diabetes. ? A weak disease-fighting system (immune system). ? Sickle cell disease. ? Gout. ? Injury of the spine. What are the signs or symptoms? Symptoms of this condition include:  Needing to pee right away (urgently).  Peeing often.  Peeing small amounts often.  Pain or burning when peeing.  Blood in the pee.  Pee that smells bad or not like normal.  Trouble peeing.  Pee that is cloudy.  Fluid coming from the  vagina, if you are female.  Pain in the belly or lower back. Other symptoms include:  Throwing up (vomiting).  No urge to eat.  Feeling mixed up (confused).  Being tired and grouchy (irritable).  A fever.  Watery poop (diarrhea). How is this treated? This condition may be treated with:  Antibiotic medicine.  Other medicines.  Drinking enough water. Follow these instructions at home:  Medicines  Take over-the-counter and prescription medicines only as told by your doctor.  If you were prescribed an antibiotic medicine, take it as told by your doctor. Do not stop taking it even if you start to feel better. General instructions  Make sure you: ? Pee until your bladder is empty. ? Do not hold pee for a long time. ? Empty your bladder after sex. ? Wipe from front to back after pooping if you are a female. Use each tissue one time when you wipe.  Drink enough fluid to keep your pee pale yellow.  Keep all follow-up visits as told by your doctor. This is important. Contact a doctor if:  You do not get better after 1-2 days.  Your symptoms go away and then come back. Get help right away if:  You have very bad back pain.  You have very bad pain in your lower belly.  You have a fever.  You are sick to your stomach (nauseous).  You are throwing up. Summary  A urinary  tract infection (UTI) is an infection of any part of the urinary tract.  This condition is caused by germs in your genital area.  There are many risk factors for a UTI. These include having a small, thin tube to drain pee and not being able to control when you pee or poop.  Treatment includes antibiotic medicines for germs.  Drink enough fluid to keep your pee pale yellow. This information is not intended to replace advice given to you by your health care provider. Make sure you discuss any questions you have with your health care provider. Document Revised: 12/14/2017 Document Reviewed:  07/06/2017 Elsevier Patient Education  2020 [employer].

## 2019-08-28 NOTE — Progress Notes (Signed)
Patient: Lindsay Ross MRN: [id number] DOB: [birth year] PCP: Delorse Limber     Subjective:  Chief Complaint  Patient presents with   Abdominal Pain   Urinary Urgency    HPI: The patient is a 39 y.o. female who presents today for abdominal pressure, and urinary urgency. She woke up this morning with this. She has pressure and pain at introitus and suprapubically. She denies any dysuria or blood. She does have frequency/urgency. She denies any fever/chills. She does have a history of UTI and has had kidney stones about 5x. She doesn't see urology. She doesn't really endorse flank pain and states this doesn't feel like a kidney stone.     Review of Systems  Constitutional: Negative for chills and fever.  Gastrointestinal: Negative for nausea and vomiting.  Genitourinary: Positive for frequency, pelvic pain and urgency. Negative for difficulty urinating, dysuria, flank pain and vaginal discharge.  Neurological: Negative for dizziness and headaches.    Allergies Patient has No Known Allergies.  Past Medical History Patient  has a past medical history of History of chickenpox and History of nephrolithiasis.  Surgical History Patient  has a past surgical history that includes Abdominal hysterectomy (2015); Cesarean section (2014); and Total abdominal hysterectomy w/ bilateral salpingoophorectomy (2014).  Family History Pateint's family history includes Alcohol abuse in her maternal grandfather and maternal grandmother; Asthma in her brother; COPD in her mother and paternal grandmother; Cancer in her father and paternal grandmother; Cancer (age of onset: 13) in her mother; Depression in her mother and paternal grandfather; Drug abuse in her mother; Early death in her mother; Hearing loss in her paternal grandfather; Heart attack in her paternal grandfather; Heart disease in her mother and paternal grandfather; Hyperlipidemia in her father; Hypertension in her father and paternal  grandfather; Miscarriages / [place] in her paternal grandmother and sister; Pulmonary embolism in her mother.  Social History Patient  reports that she has been smoking cigarettes. She has been smoking about 0.50 packs per day. She has never used smokeless tobacco. She reports previous drug use. She reports that she does not drink alcohol.    Objective: Vitals:   08/28/19 1305  BP: (!) 102/58  Pulse: 91  Temp: 98.3 F (36.8 C)  TempSrc: Temporal  SpO2: 97%  Weight: 177 lb (80.3 kg)  Height: 5\' 4"  (1.626 m)    Body mass index is 30.38 kg/m.  Physical Exam Vitals reviewed.  Constitutional:      Appearance: She is obese.  HENT:     Head: Normocephalic and atraumatic.  Cardiovascular:     Rate and Rhythm: Normal rate and regular rhythm.  Pulmonary:     Effort: Pulmonary effort is normal.     Breath sounds: Normal breath sounds.  Abdominal:     General: Abdomen is flat. Bowel sounds are normal.     Palpations: Abdomen is soft.     Tenderness: There is abdominal tenderness in the suprapubic area. There is no right CVA tenderness or left CVA tenderness.  Skin:    Capillary Refill: Capillary refill takes less than 2 seconds.  Neurological:     General: No focal deficit present.     Mental Status: She is alert and oriented to person, place, and time.  Psychiatric:        Mood and Affect: Mood normal.        Behavior: Behavior normal.    UA: 3+ blood, 1+ protein, cloudy    Assessment/plan: 1. Acute cystitis without hematuria 5 day  course of bactrim, culture pending. 2 day course prn pyridium. Encouraged fluids. precautions given for flank pain, nausea/vomiting or fevers. i'll f/u on culture.  - POCT urinalysis dipstick - Urine Culture    This visit occurred during the SARS-CoV-2 public health emergency.  Safety protocols were in place, including screening questions prior to the visit, additional usage of staff PPE, and extensive cleaning of exam room while observing  appropriate contact time as indicated for disinfecting solutions.     Return if symptoms worsen or fail to improve.   Orma Flaming, MD [place]   08/28/2019

## 2019-08-29 LAB — URINE CULTURE
MICRO NUMBER:: 10842175
SPECIMEN QUALITY:: ADEQUATE

## 2019-09-26 ENCOUNTER — Encounter: Payer: Self-pay | Admitting: Physician Assistant

## 2019-09-26 DIAGNOSIS — M797 Fibromyalgia: Secondary | ICD-10-CM

## 2019-10-08 MED ORDER — GABAPENTIN 300 MG PO CAPS: ORAL_CAPSULE | ORAL | 5 refills | Status: DC

## 2020-01-08 ENCOUNTER — Telehealth: Payer: Self-pay | Admitting: Physician Assistant

## 2020-01-08 DIAGNOSIS — M797 Fibromyalgia: Secondary | ICD-10-CM

## 2020-01-08 MED ORDER — GABAPENTIN 300 MG PO CAPS: ORAL_CAPSULE | ORAL | 5 refills | Status: DC

## 2020-01-08 NOTE — Telephone Encounter (Signed)
Rx sent to the preferred patient pharmacy  

## 2020-01-08 NOTE — Telephone Encounter (Signed)
Patient needs her gabapentin called into [facility] in [place].  [facility] in [place] does not accept [facility] and script was going to be over 100 dollars.

## 2020-03-16 ENCOUNTER — Emergency Department (HOSPITAL_COMMUNITY)
Admission: EM | Admit: 2020-03-16 | Discharge: 2020-03-16 | Disposition: A | Discharge status: Home / Self Care | Attending: Emergency Medicine | Admitting: Emergency Medicine

## 2020-03-16 ENCOUNTER — Encounter (HOSPITAL_COMMUNITY): Payer: Self-pay | Admitting: *Deleted

## 2020-03-16 ENCOUNTER — Other Ambulatory Visit: Payer: Self-pay

## 2020-03-16 ENCOUNTER — Emergency Department (HOSPITAL_COMMUNITY)

## 2020-03-16 ENCOUNTER — Ambulatory Visit: Admitting: Family Medicine

## 2020-03-16 DIAGNOSIS — R109 Unspecified abdominal pain: Secondary | ICD-10-CM | POA: Diagnosis present

## 2020-03-16 DIAGNOSIS — F17210 Nicotine dependence, cigarettes, uncomplicated: Secondary | ICD-10-CM | POA: Insufficient documentation

## 2020-03-16 DIAGNOSIS — N200 Calculus of kidney: Secondary | ICD-10-CM

## 2020-03-16 LAB — CBC
HCT: 46 % (ref 36.0–46.0)
Hemoglobin: 14.9 g/dL (ref 12.0–15.0)
MCH: 31.2 pg (ref 26.0–34.0)
MCHC: 32.4 g/dL (ref 30.0–36.0)
MCV: 96.4 fL (ref 80.0–100.0)
Platelets: 283 10*3/uL (ref 150–400)
RBC: 4.77 MIL/uL (ref 3.87–5.11)
RDW: 13.2 % (ref 11.5–15.5)
WBC: 18 10*3/uL — ABNORMAL HIGH (ref 4.0–10.5)
nRBC: 0 % (ref 0.0–0.2)

## 2020-03-16 LAB — BASIC METABOLIC PANEL
Anion gap: 9 (ref 5–15)
BUN: 11 mg/dL (ref 6–20)
CO2: 21 mmol/L — ABNORMAL LOW (ref 22–32)
Calcium: 9.2 mg/dL (ref 8.9–10.3)
Chloride: 106 mmol/L (ref 98–111)
Creatinine, Ser: 0.73 mg/dL (ref 0.44–1.00)
GFR, Estimated: >60 mL/min (ref >60)
Glucose, Bld: 112 mg/dL — ABNORMAL HIGH (ref 70–99)
Potassium: 4.4 mmol/L (ref 3.5–5.1)
Sodium: 136 mmol/L (ref 135–145)

## 2020-03-16 LAB — URINALYSIS, ROUTINE W REFLEX MICROSCOPIC
Bilirubin Urine: NEGATIVE
Glucose, UA: NEGATIVE mg/dL
Ketones, ur: NEGATIVE mg/dL
Leukocytes,Ua: NEGATIVE
Nitrite: NEGATIVE
Protein, ur: NEGATIVE mg/dL
Specific Gravity, Urine: 1.003 — ABNORMAL LOW (ref 1.005–1.030)
pH: 6 (ref 5.0–8.0)

## 2020-03-16 LAB — POC URINE PREG, ED: Preg Test, Ur: NEGATIVE

## 2020-03-16 MED ORDER — SODIUM CHLORIDE 0.9 % IV BOLUS
1000.0000 mL | Freq: Once | INTRAVENOUS | Status: AC
  Administered 2020-03-16: 1000 mL via INTRAVENOUS

## 2020-03-16 MED ORDER — ONDANSETRON HCL 4 MG/2ML IJ SOLN
4.0000 mg | Freq: Once | INTRAMUSCULAR | Status: AC
  Administered 2020-03-16: 4 mg via INTRAVENOUS
  Filled 2020-03-16: qty 2

## 2020-03-16 MED ORDER — FENTANYL CITRATE (PF) 100 MCG/2ML IJ SOLN
75.0000 ug | Freq: Once | INTRAMUSCULAR | Status: AC
  Administered 2020-03-16: 75 ug via INTRAVENOUS
  Filled 2020-03-16: qty 2

## 2020-03-16 NOTE — ED Triage Notes (Signed)
Pain in left lower quadrant, history of kidney stones

## 2020-03-16 NOTE — ED Provider Notes (Signed)
[facility] Provider Note   CSN: [id number] Arrival date & time: 03/16/20  1007     History No chief complaint on file.   Lindsay Ross is a 40 y.o. female.  HPI   Patient with significant medical history of nephrolithiasis presents with chief complaint of left-sided flank pain.  She endorses pain started approxi this morning around 730, she describes it as a sharp sensation which she feels in her flank, it waxes and wanes in intensity, cannot find a position of comfort.  She has associated nausea and vomiting, increase urinary frequency.  She denies hematuria, dysuria, vaginal discharge or vaginal bleeding, has no abdominal pain.  She has no significant abdominal history, no history of diverticulitis, pancreatitis, no history of stomach ulcers.  She states she has had kidney stones in the past this feels very similar to previous presentations.  She denies  alleviating factors.  Patient denies headaches, fevers, chills, shortness of breath, chest pain, abdominal pain, worsening pedal edema.  Past Medical History:  Diagnosis Date  . History of chickenpox   . History of nephrolithiasis     Patient Active Problem List   Diagnosis Date Noted  . Anxiety disorder 11/08/2018  . Fibromyalgia 11/08/2018    Past Surgical History:  Procedure Laterality Date  . ABDOMINAL HYSTERECTOMY  2015   No ovaries, no uterus  . CESAREAN SECTION  2014  . TOTAL ABDOMINAL HYSTERECTOMY W/ BILATERAL SALPINGOOPHORECTOMY  2014     OB History   No obstetric history on file.     Family History  Problem Relation Age of Onset  . Cancer Mother 104       Lung  . COPD Mother   . Depression Mother   . Drug abuse Mother   . Early death Mother   . Heart disease Mother   . Pulmonary embolism Mother   . Cancer Father        RCC  . Hyperlipidemia Father   . Hypertension Father   . Miscarriages / Stillbirths Sister   . Asthma Brother   . Alcohol abuse Maternal Grandmother   . Alcohol  abuse Maternal Grandfather   . Cancer Paternal Grandmother        Breast  . COPD Paternal Grandmother   . Miscarriages / Stillbirths Paternal Grandmother   . Depression Paternal Grandfather   . Heart disease Paternal Grandfather   . Heart attack Paternal Grandfather   . Hypertension Paternal Grandfather   . Hearing loss Paternal Grandfather     Social History   Tobacco Use  . Smoking status: Current Every Day Smoker    Packs/day: 0.50    Types: Cigarettes  . Smokeless tobacco: Never Used  . Tobacco comment: 10-15 cigs per day  Vaping Use  . Vaping Use: Never used  Substance Use Topics  . Alcohol use: Never  . Drug use: Not Currently    Home Medications Prior to Admission medications   Medication Sig Start Date End Date Taking? Authorizing Provider  diazepam (VALIUM) 5 MG tablet Take 1-2 tablets by mouth twice daily as needed Patient not taking: Reported on 08/28/2019 10/25/18   [provider]  diphenhydrAMINE (BENADRYL) 25 MG tablet Take 1-2 tablets at bedtime for sleep    [provider]  escitalopram (LEXAPRO) 20 MG tablet Take 20 mg by mouth daily. 07/29/19   [provider]  estradiol (ESTRACE VAGINAL) 0.1 MG/GM vaginal cream Apply 1 applicator nightly for 2 weeks. Then decreased to 1 applicator 3 times  weekly. Patient not taking: Reported on 08/28/2019 11/08/18   Brunetta Jeans, PA-C  gabapentin (NEURONTIN) 300 MG capsule Take 1 capsule by mouth at noon. Take 3 capsules by mouth in the evening. 01/08/20   Brunetta Jeans, PA-C  Multiple Vitamin (MULTIVITAMIN) tablet Take 1 tablet by mouth daily.    [provider]  phenazopyridine (PYRIDIUM) 100 MG tablet Take 1 tablet (100 mg total) by mouth 3 (three) times daily as needed for pain. 08/28/19   Orma Flaming, MD  sulfamethoxazole-trimethoprim (BACTRIM DS) 800-160 MG tablet Take 1 tablet by mouth 2 (two) times daily. 08/28/19   Orma Flaming, MD    Allergies    Patient has no known  allergies.  Review of Systems   Review of Systems  Constitutional: Negative for chills and fever.  HENT: Negative for congestion and sore throat.   Respiratory: Negative for shortness of breath.   Cardiovascular: Negative for chest pain.  Gastrointestinal: Positive for nausea and vomiting. Negative for abdominal pain and diarrhea.  Genitourinary: Positive for flank pain and frequency. Negative for enuresis, vaginal bleeding, vaginal discharge and vaginal pain.  Musculoskeletal: Negative for back pain.  Skin: Negative for rash.  Neurological: Negative for dizziness and headaches.  Hematological: Does not bruise/bleed easily.    Physical Exam Updated Vital Signs BP 103/65   Pulse 79   Temp 98.1 F (36.7 C) (Oral)   Resp 16   Ht 5\' 3"  (1.6 m)   Wt 79.4 kg   SpO2 96%   BMI 31.00 kg/m   Physical Exam Vitals and nursing note reviewed.  Constitutional:      General: She is not in acute distress.    Appearance: She is not ill-appearing.  HENT:     Head: Normocephalic and atraumatic.     Nose: No congestion.  Eyes:     Conjunctiva/sclera: Conjunctivae normal.  Cardiovascular:     Rate and Rhythm: Normal rate and regular rhythm.     Pulses: Normal pulses.     Heart sounds: No murmur heard. No friction rub. No gallop.   Pulmonary:     Effort: No respiratory distress.     Breath sounds: No wheezing, rhonchi or rales.  Abdominal:     Palpations: Abdomen is soft.     Tenderness: There is left CVA tenderness. There is no right CVA tenderness or guarding.  Musculoskeletal:     Right lower leg: No edema.     Left lower leg: No edema.     Comments: Spine was palpated it was nontender to palpation, no step-off or crepitus present.  She had noted left-sided flank pain.   Skin:    General: Skin is warm and dry.  Neurological:     Mental Status: She is alert.  Psychiatric:        Mood and Affect: Mood normal.     ED Results / Procedures / Treatments   Labs (all labs ordered  are listed, but only abnormal results are displayed) Labs Reviewed  URINALYSIS, ROUTINE W REFLEX MICROSCOPIC - Abnormal; Notable for the following components:      Result Value   Color, Urine STRAW (*)    Specific Gravity, Urine 1.003 (*)    Hgb urine dipstick LARGE (*)    Bacteria, UA RARE (*)    All other components within normal limits  CBC - Abnormal; Notable for the following components:   WBC 18.0 (*)    All other components within normal limits  BASIC METABOLIC PANEL -  Abnormal; Notable for the following components:   CO2 21 (*)    Glucose, Bld 112 (*)    All other components within normal limits  POC URINE PREG, ED    EKG None  Radiology CT Renal Stone Study  Result Date: 03/16/2020 CLINICAL DATA:  Left flank pain. EXAM: CT ABDOMEN AND PELVIS WITHOUT CONTRAST TECHNIQUE: Multidetector CT imaging of the abdomen and pelvis was performed following the standard protocol without IV contrast. COMPARISON:  None. FINDINGS: Lower chest: Very mild atelectasis is seen within the posterior aspects of the bilateral lung bases. Hepatobiliary: There is diffuse fatty infiltration of the liver parenchyma. No focal liver abnormality is seen. No gallstones, gallbladder wall thickening, or biliary dilatation. Pancreas: Unremarkable. No pancreatic ductal dilatation or surrounding inflammatory changes. Spleen: A 2.4 cm x 1.9 cm ill-defined area of parenchymal low attenuation is seen within the spleen (axial CT image 24, CT series number 2). Similar appearing 1.5 cm x 1.7 cm and 1.7 cm x 0.9 cm areas of parenchymal low attenuation (approximately 21.28 Hounsfield units) are seen within the anterior aspect of the spleen (axial CT image 29, CT series number 2). Adrenals/Urinary Tract: Adrenal glands are unremarkable. Kidneys are normal in size without focal lesions. 2 mm nonobstructing renal stones are seen within the mid and lower right kidney. 2 mm, 3 mm and 4 mm nonobstructing renal stones are seen within  the left kidney. A 2 mm obstructing renal stone is seen at the left UVJ, with very mild left-sided hydronephrosis. Bladder is unremarkable. Stomach/Bowel: Stomach is within normal limits. Appendix appears normal. No evidence of bowel wall thickening, distention, or inflammatory changes. Vascular/Lymphatic: No significant vascular findings are present. No enlarged abdominal or pelvic lymph nodes. Reproductive: Status post hysterectomy. No adnexal masses. Other: No abdominal wall hernia or abnormality. No abdominopelvic ascites. Musculoskeletal: No acute or significant osseous findings. IMPRESSION: 1. 2 mm obstructing renal stone at the left UVJ. 2. Bilateral subcentimeter nonobstructing renal stones. 3. Hepatic steatosis. 4. Incidental ill-defined splenic lesions, as described above. MRI correlation is recommended to further exclude the presence of an underlying neoplastic process. Electronically Signed   By: Virgina Norfolk M.D.   On: 03/16/2020 14:25    Procedures Procedures   Medications Ordered in ED Medications  sodium chloride 0.9 % bolus 1,000 mL (0 mLs Intravenous Stopped 03/16/20 1409)  ondansetron (ZOFRAN) injection 4 mg (4 mg Intravenous Given 03/16/20 1323)  fentaNYL (SUBLIMAZE) injection 75 mcg (75 mcg Intravenous Given 03/16/20 1322)    ED Course  I have reviewed the triage vital signs and the nursing notes.  Pertinent labs & imaging results that were available during my care of the patient were reviewed by me and considered in my medical decision making (see chart for details).    MDM Rules/Calculators/A&P                         Initial impression-patient presents with left-sided flank pain.  She is alert, does not appear acute distress, vital signs reassuring.  Concern for kidney stone versus [place] versus UTI, will obtain basic lab work-up, provide patient with a liter of fluids, antiemetics, pain medications and reevaluate.  Work-up-CBC shows leukocytosis of 18.0, baseline is  around 15, no signs of anemia.  BMP shows decrease CO2 of 21, hyperglycemia of 112, no anion gap present.  UA negative for nitrates or leukocytes, shows 6-10 red blood cells, rare bacteria.  CT renal shows 2 mm obstructing renal stone at the  left UVJ, also shows incidental ill-defined splenic lesions approximately same size as prior.  Recommends MRI for further evaluation.  Reassessment patient was reassessed after providing her with a liter of fluid, antiemetics, and fentanyl.  She states she feels much better, has no complaints at this time.  Vital signs and lab work are all reassuring.  Updated patient she is, for discharge at this time.  Rule out-I have low suspicion for septic stone as UA is unremarkable for signs of infection.  Low suspicion for UTI or pyelonephritis as UTI unremarkable for signs of infection.  Patient does have noted increase leukocytosis but at baseline patient has elevated Stickels count of 15, suspect the slight increase is secondary due to acute phase reactant doubt infectious etiology.  Low suspicion for intra-abdominal abnormality as patient denies abdominal pain, abdomen soft nontender to palpation, no peritoneal signs on my exam.  Plan-suspect patient is having from a kidney stone, and should pass on its own.  Will recommend over-the-counter pain medication hydrated follow-up with urology.  She also has an incidental finding of a splenic lesion as seen on prior imaging will recommend she follows up with her primary care doctor for further evaluation.  Vital signs have remained stable, no indication for hospital admission.  Patient given at home care as well strict return precautions.  Patient verbalized that they understood agreed to said plan.   Final Clinical Impression(s) / ED Diagnoses Final diagnoses:  Nephrolithiasis    Rx / DC Orders ED Discharge Orders    None       Marcello Fennel, PA-C 03/16/20 1520    Wyvonnia Dusky, MD 03/16/20 Bosie Helper

## 2020-03-16 NOTE — Discharge Instructions (Addendum)
Suspect you are suffering from a kidney stone.  Its 2 mm and should pass on its own.  Recommend staying hydrated and take over-the-counter pain medications like ibuprofen and/or Tylenol every 6 hours as needed please follow dosing on the back of bottle.  Your scan showed that you have a 2.4 cm lesion on your spleen, radiology recommends MRI for further evaluation.  Please follow-up with your primary care doctor.  Given you a referral to a urologist here in [place] please call to schedule a follow-up appointment.  Come back to the emergency department if you develop chest pain, shortness of breath, severe abdominal pain, uncontrolled nausea, vomiting, diarrhea.

## 2020-04-06 ENCOUNTER — Encounter: Payer: Self-pay | Admitting: Registered Nurse

## 2020-04-06 ENCOUNTER — Ambulatory Visit: Admitting: Registered Nurse

## 2020-04-06 ENCOUNTER — Other Ambulatory Visit: Payer: Self-pay

## 2020-04-06 VITALS — BP 122/84 | HR 90 | Temp 98.0°F | Resp 18 | Ht 63.0 in | Wt 182.0 lb

## 2020-04-06 DIAGNOSIS — F411 Generalized anxiety disorder: Secondary | ICD-10-CM

## 2020-04-06 DIAGNOSIS — R1900 Intra-abdominal and pelvic swelling, mass and lump, unspecified site: Secondary | ICD-10-CM | POA: Diagnosis not present

## 2020-04-06 DIAGNOSIS — Q8909 Congenital malformations of spleen: Secondary | ICD-10-CM

## 2020-04-06 MED ORDER — HYDROXYZINE HCL 10 MG PO TABS: 5.0000 mg | ORAL_TABLET | Freq: Three times a day (TID) | ORAL | 0 refills | Status: DC | PRN

## 2020-04-06 NOTE — Progress Notes (Signed)
Established Patient Office Visit  Subjective:  Patient ID: Lindsay Ross, female    DOB: [birth year]  Age: 40 y.o. MRN: [id number]  CC:  Chief Complaint  Patient presents with  . Transitions Of Care    Patient states she is here for a TOC. Also to discuss hospital follow up.    HPI Lindsay Ross presents for visit to est care.  Histories reviewed, updated as warranted.   Follow up on recent hospital visit. Visit for nephrolithiasis with flank pain, nv.  Has had multiple before, no new symptoms. CT to confirm showed incidental finding of steatosis of liver and ill defined splenic lesions of uncertain etiology Cbc showed elevated WBC at 18, no other abnormalities.  Concerned for follow up plan today.    Past Medical History:  Diagnosis Date  . History of chickenpox   . History of nephrolithiasis     Past Surgical History:  Procedure Laterality Date  . ABDOMINAL HYSTERECTOMY  2015   No ovaries, no uterus  . CESAREAN SECTION  2014  . TOTAL ABDOMINAL HYSTERECTOMY W/ BILATERAL SALPINGOOPHORECTOMY  2014    Family History  Problem Relation Age of Onset  . Cancer Mother 78       Lung - smoker  . COPD Mother   . Depression Mother   . Drug abuse Mother   . Early death Mother   . Heart disease Mother   . Pulmonary embolism Mother   . Cancer Father        RCC  . Hyperlipidemia Father   . Hypertension Father   . Miscarriages / Stillbirths Sister   . Asthma Brother   . Alcohol abuse Maternal Grandmother   . Alcohol abuse Maternal Grandfather   . Cancer Paternal Grandmother        Breast  . COPD Paternal Grandmother   . Miscarriages / Stillbirths Paternal Grandmother   . Depression Paternal Grandfather   . Heart disease Paternal Grandfather   . Heart attack Paternal Grandfather   . Hypertension Paternal Grandfather   . Hearing loss Paternal Grandfather     Social History   Socioeconomic History  . Marital status: Married    Spouse name: Not on file  . Number of  children: 0  . Years of education: Not on file  . Highest education level: Not on file  Occupational History  . Not on file  Tobacco Use  . Smoking status: Current Every Day Smoker    Packs/day: 0.50    Types: Cigarettes  . Smokeless tobacco: Never Used  . Tobacco comment: 10-15 cigs per day  Vaping Use  . Vaping Use: Never used  Substance and Sexual Activity  . Alcohol use: Never  . Drug use: Not Currently  . Sexual activity: Yes    Birth control/protection: Surgical  Other Topics Concern  . Not on file  Social History Narrative  . Not on file   Social Determinants of Health   Financial Resource Strain: Not on file  Food Insecurity: Not on file  Transportation Needs: Not on file  Physical Activity: Not on file  Stress: Not on file  Social Connections: Not on file  Intimate Partner Violence: Not on file    Outpatient Medications Prior to Visit  Medication Sig Dispense Refill  . buPROPion (WELLBUTRIN XL) 150 MG 24 hr tablet Take 150 mg by mouth daily.    . diazepam (VALIUM) 5 MG tablet Take 1-2 tablets by mouth twice daily as needed    . escitalopram (  LEXAPRO) 20 MG tablet Take 20 mg by mouth daily.    Marland Kitchen gabapentin (NEURONTIN) 300 MG capsule Take 1 capsule by mouth at noon. Take 3 capsules by mouth in the evening. 120 capsule 5  . Multiple Vitamin (MULTIVITAMIN) tablet Take 1 tablet by mouth daily.    . phenazopyridine (PYRIDIUM) 100 MG tablet Take 1 tablet (100 mg total) by mouth 3 (three) times daily as needed for pain. 10 tablet 0  . sulfamethoxazole-trimethoprim (BACTRIM DS) 800-160 MG tablet Take 1 tablet by mouth 2 (two) times daily. 10 tablet 0  . diphenhydrAMINE (BENADRYL) 25 MG tablet Take 1-2 tablets at bedtime for sleep    . estradiol (ESTRACE VAGINAL) 0.1 MG/GM vaginal cream Apply 1 applicator nightly for 2 weeks. Then decreased to 1 applicator 3 times weekly. (Patient not taking: No sig reported) 42.5 g 1   No facility-administered medications prior to visit.     No Known Allergies  ROS Review of Systems  Constitutional: Negative.   HENT: Negative.   Eyes: Negative.   Respiratory: Negative.   Cardiovascular: Negative.   Gastrointestinal: Negative.   Genitourinary: Negative.   Musculoskeletal: Negative.   Skin: Negative.   Neurological: Negative.   Psychiatric/Behavioral: Negative.   All other systems reviewed and are negative.     Objective:    Physical Exam Vitals and nursing note reviewed.  Constitutional:      General: She is not in acute distress.    Appearance: Normal appearance. She is normal weight. She is not ill-appearing, toxic-appearing or diaphoretic.  Cardiovascular:     Rate and Rhythm: Normal rate and regular rhythm.     Heart sounds: Normal heart sounds. No murmur heard. No friction rub. No gallop.   Pulmonary:     Effort: Pulmonary effort is normal. No respiratory distress.     Breath sounds: Normal breath sounds. No stridor. No wheezing, rhonchi or rales.  Chest:     Chest wall: No tenderness.  Skin:    General: Skin is warm and dry.  Neurological:     General: No focal deficit present.     Mental Status: She is alert and oriented to person, place, and time. Mental status is at baseline.  Psychiatric:        Mood and Affect: Mood normal.        Behavior: Behavior normal.        Thought Content: Thought content normal.        Judgment: Judgment normal.     BP 122/84   Pulse 90   Temp 98 F (36.7 C) (Temporal)   Resp 18   Ht 5\' 3"  (1.6 m)   Wt 182 lb (82.6 kg)   SpO2 99%   BMI 32.24 kg/m  Wt Readings from Last 3 Encounters:  04/06/20 182 lb (82.6 kg)  03/16/20 175 lb (79.4 kg)  08/28/19 177 lb (80.3 kg)     There are no preventive care reminders to display for this patient.  There are no preventive care reminders to display for this patient.  Lab Results  Component Value Date   TSH 1.534 12/31/2018   Lab Results  Component Value Date   WBC 18.0 (H) 03/16/2020   HGB 14.9  03/16/2020   HCT 46.0 03/16/2020   MCV 96.4 03/16/2020   PLT 283 03/16/2020   Lab Results  Component Value Date   NA 136 03/16/2020   K 4.4 03/16/2020   CO2 21 (L) 03/16/2020   GLUCOSE 112 (H) 03/16/2020  BUN 11 03/16/2020   CREATININE 0.73 03/16/2020   BILITOT 0.3 12/31/2018   ALKPHOS 118 12/31/2018   AST 18 12/31/2018   ALT 29 12/31/2018   PROT 8.0 12/31/2018   ALBUMIN 4.3 12/31/2018   CALCIUM 9.2 03/16/2020   ANIONGAP 9 03/16/2020   GFR 99.30 11/08/2018   Lab Results  Component Value Date   CHOL 228 (H) 11/08/2018   Lab Results  Component Value Date   HDL 48.70 11/08/2018   No results found for: [facility] Lab Results  Component Value Date   TRIG 333.0 (H) 11/08/2018   Lab Results  Component Value Date   CHOLHDL 5 11/08/2018   Lab Results  Component Value Date   HGBA1C 5.6 11/08/2018      Assessment & Plan:   Problem List Items Addressed This Visit      Other   Anxiety disorder   Relevant Medications   buPROPion (WELLBUTRIN XL) 150 MG 24 hr tablet   hydrOXYzine (ATARAX/VISTARIL) 10 MG tablet    Other Visit Diagnoses    Spleen anomaly    -  Primary   Relevant Orders   MR Abdomen W Wo Contrast   Intra-abdominal and pelvic swelling, mass and lump, unspecified site       Relevant Orders   MR Abdomen W Wo Contrast      Meds ordered this encounter  Medications  . hydrOXYzine (ATARAX/VISTARIL) 10 MG tablet    Sig: Take 0.5-1 tablets (5-10 mg total) by mouth 3 (three) times daily as needed.    Dispense:  90 tablet    Refill:  0    Order Specific Question:   Supervising Provider    Answer:   Carlota Raspberry, JEFFREY R [[id number]]    Follow-up: No follow-ups on file.   PLAN  No findings on exam or history suggestive of splenic dysfunction but will closely monitor. Given strict ER precautions. MRI to follow up on CT. Return in 1-2 weeks for repeat CBC and labs if not able to do MRI.  Will try hydroxyzine 5-10mg  PO tid PRN for anxiety given that diazepam not  as effective for breakthrough anxiety. Suggest 10-20mg  PO qhs PRN for sleep disturbance.  Plan follow up based on MRI result  Patient encouraged to call clinic with any questions, comments, or concerns.  Maximiano Coss, NP

## 2020-04-06 NOTE — Patient Instructions (Signed)
° ° ° °  If you have lab work done today you will be contacted with your lab results within the next 2 weeks.  If you have not heard from us then please contact us. The fastest way to get your results is to register for My Chart. ° ° °IF you received an x-ray today, you will receive an invoice from [facility]. Please contact [facility] at [phone] with questions or concerns regarding your invoice.  ° °IF you received labwork today, you will receive an invoice from [facility]. Please contact [facility] at [phone] with questions or concerns regarding your invoice.  ° °Our billing staff will not be able to assist you with questions regarding bills from these companies. ° °You will be contacted with the lab results as soon as they are available. The fastest way to get your results is to activate your My Chart account. Instructions are located on the last page of this paperwork. If you have not heard from us regarding the results in 2 weeks, please contact this office. °  ° ° ° °

## 2020-04-15 ENCOUNTER — Ambulatory Visit (HOSPITAL_COMMUNITY)
Admission: RE | Admit: 2020-04-15 | Discharge: 2020-04-15 | Disposition: A | Discharge status: Home / Self Care | Source: Ambulatory Visit | Attending: Registered Nurse | Admitting: Registered Nurse

## 2020-04-15 ENCOUNTER — Other Ambulatory Visit: Payer: Self-pay

## 2020-04-15 DIAGNOSIS — Q8909 Congenital malformations of spleen: Secondary | ICD-10-CM | POA: Diagnosis present

## 2020-04-15 DIAGNOSIS — R1900 Intra-abdominal and pelvic swelling, mass and lump, unspecified site: Secondary | ICD-10-CM | POA: Diagnosis present

## 2020-04-15 MED ORDER — GADOBUTROL 1 MMOL/ML IV SOLN
8.0000 mL | Freq: Once | INTRAVENOUS | Status: AC | PRN
  Administered 2020-04-15: 8 mL via INTRAVENOUS

## 2020-04-17 ENCOUNTER — Ambulatory Visit: Admitting: Family Medicine

## 2020-04-20 ENCOUNTER — Telehealth: Payer: Self-pay

## 2020-04-20 ENCOUNTER — Other Ambulatory Visit: Payer: Self-pay | Admitting: Registered Nurse

## 2020-04-20 ENCOUNTER — Encounter: Payer: Self-pay | Admitting: Registered Nurse

## 2020-04-20 DIAGNOSIS — K760 Fatty (change of) liver, not elsewhere classified: Secondary | ICD-10-CM

## 2020-04-20 DIAGNOSIS — R160 Hepatomegaly, not elsewhere classified: Secondary | ICD-10-CM

## 2020-04-20 NOTE — Telephone Encounter (Signed)
Pt called about MRI results. Pt saw the results on her MyChart and is concerned. Please call to discuss results.

## 2020-04-20 NOTE — Telephone Encounter (Signed)
Results in chart - sent message to pt. Ok to call to discuss with her to ensure she's seen them  Thank you  Denice Paradise

## 2020-04-20 NOTE — Telephone Encounter (Signed)
Please advise on pt recent MRI results so I can call pt

## 2020-04-22 ENCOUNTER — Encounter (INDEPENDENT_AMBULATORY_CARE_PROVIDER_SITE_OTHER): Payer: Self-pay | Admitting: *Deleted

## 2020-05-07 ENCOUNTER — Encounter: Payer: Self-pay | Admitting: Registered Nurse

## 2020-05-07 NOTE — Telephone Encounter (Signed)
Please advise patient has a rash. Would you like to schedule an virtual with the patient ?

## 2020-05-07 NOTE — Telephone Encounter (Signed)
Yes would need OV to assess and treat

## 2020-05-08 ENCOUNTER — Encounter: Payer: Self-pay | Admitting: Registered Nurse

## 2020-05-08 ENCOUNTER — Other Ambulatory Visit: Payer: Self-pay

## 2020-05-08 ENCOUNTER — Telehealth (INDEPENDENT_AMBULATORY_CARE_PROVIDER_SITE_OTHER): Admitting: Registered Nurse

## 2020-05-08 DIAGNOSIS — B029 Zoster without complications: Secondary | ICD-10-CM

## 2020-05-08 MED ORDER — VALACYCLOVIR HCL 1 G PO TABS: 1000.0000 mg | ORAL_TABLET | Freq: Three times a day (TID) | ORAL | 0 refills | Status: DC

## 2020-05-08 MED ORDER — TRAMADOL HCL 50 MG PO TABS: 50.0000 mg | ORAL_TABLET | Freq: Three times a day (TID) | ORAL | 0 refills | Status: AC | PRN

## 2020-05-08 NOTE — Progress Notes (Signed)
Telemedicine Encounter- SOAP NOTE Established Patient  This telephone encounter was conducted with the patient's (or proxy's) verbal consent via audio telecommunications: yes  Patient was instructed to have this encounter in a suitably private space; and to only have persons present to whom they give permission to participate. In addition, patient identity was confirmed by use of name plus two identifiers (DOB and address).  I discussed the limitations, risks, security and privacy concerns of performing an evaluation and management service by telephone and the availability of in person appointments. I also discussed with the patient that there may be a patient responsible charge related to this service. The patient expressed understanding and agreed to proceed.  I spent a total of 15 minutes talking with the patient or their proxy.  Patient at home Provider in office Participants: Lindsay Ruddy, NP and Lindsay Ross.  Chief Complaint  Patient presents with  . Herpes Zoster    Patient states she rash that's on her right side . Patient states this has been going on for about 3 days and so painful to even touch. She states rash is on spine, under breast, and underarm.    Subjective   Lindsay Ross is a 40 y.o. established patient. Telephone visit today for rash  HPI Onset 3-4 days ago vesicular in appearance Started out as mildly itchy, now it is painful and ttp Distributed about midway down back on R side of spine, some vesicles in linear pattern wrapping around R side and under R breast Pt has iced rash which has helped Has not had rash like this before Did have chicken pox as a child No known exposure to shingles.  Patient Active Problem List   Diagnosis Date Noted  . Anxiety disorder 11/08/2018  . Fibromyalgia 11/08/2018    Past Medical History:  Diagnosis Date  . History of chickenpox   . History of nephrolithiasis     Current Outpatient Medications  Medication Sig  Dispense Refill  . buPROPion (WELLBUTRIN XL) 150 MG 24 hr tablet Take 150 mg by mouth daily.    . diazepam (VALIUM) 5 MG tablet Take 1-2 tablets by mouth twice daily as needed    . escitalopram (LEXAPRO) 20 MG tablet Take 20 mg by mouth daily.    Marland Kitchen gabapentin (NEURONTIN) 300 MG capsule Take 1 capsule by mouth at noon. Take 3 capsules by mouth in the evening. 120 capsule 5  . hydrOXYzine (ATARAX/VISTARIL) 10 MG tablet Take 0.5-1 tablets (5-10 mg total) by mouth 3 (three) times daily as needed. 90 tablet 0  . Multiple Vitamin (MULTIVITAMIN) tablet Take 1 tablet by mouth daily.    . traMADol (ULTRAM) 50 MG tablet Take 1 tablet (50 mg total) by mouth every 8 (eight) hours as needed for up to 5 days. 15 tablet 0  . valACYclovir (VALTREX) 1000 MG tablet Take 1 tablet (1,000 mg total) by mouth 3 (three) times daily. 21 tablet 0   No current facility-administered medications for this visit.    No Known Allergies  Social History   Socioeconomic History  . Marital status: Married    Spouse name: Not on file  . Number of children: 0  . Years of education: Not on file  . Highest education level: Not on file  Occupational History  . Not on file  Tobacco Use  . Smoking status: Current Every Day Smoker    Packs/day: 0.50    Types: Cigarettes  . Smokeless tobacco: Never Used  . Tobacco comment:  10-15 cigs per day  Vaping Use  . Vaping Use: Never used  Substance and Sexual Activity  . Alcohol use: Never  . Drug use: Not Currently  . Sexual activity: Yes    Birth control/protection: Surgical  Other Topics Concern  . Not on file  Social History Narrative  . Not on file   Social Determinants of Health   Financial Resource Strain: Not on file  Food Insecurity: Not on file  Transportation Needs: Not on file  Physical Activity: Not on file  Stress: Not on file  Social Connections: Not on file  Intimate Partner Violence: Not on file    ROS Per hpi   Objective   Images sent by Pt via  MyChart:        Vitals as reported by the patient: There were no vitals filed for this visit.  Lindsay Ross was seen today for herpes zoster.  Diagnoses and all orders for this visit:  Herpes zoster without complication -     valACYclovir (VALTREX) 1000 MG tablet; Take 1 tablet (1,000 mg total) by mouth 3 (three) times daily. -     traMADol (ULTRAM) 50 MG tablet; Take 1 tablet (50 mg total) by mouth every 8 (eight) hours as needed for up to 5 days.    PLAN  History and symptoms suggest shingles. Discussed disease course and expectations with patient  She will increase gabapentin from 300-900 noon-night daily dosing to 300-600-900 for pain relief. I will also send tramadol, though discussed the potential limits of it's efficacy for shingles.  Valtrex 1000mg  PO tid for one week. Discussed that while it is best to start as soon as possible after onset of rash, it is a fairly benign medication with very low risk to start later, even if potential benefit is limited.   Return if worsening of failing to improve  Patient encouraged to call clinic with any questions, comments, or concerns.    I discussed the assessment and treatment plan with the patient. The patient was provided an opportunity to ask questions and all were answered. The patient agreed with the plan and demonstrated an understanding of the instructions.   The patient was advised to call back or seek an in-person evaluation if the symptoms worsen or if the condition fails to improve as anticipated.  I provided 15 minutes of non-face-to-face time during this encounter.  Maximiano Coss, NP  Primary Care at [facility]

## 2020-05-08 NOTE — Patient Instructions (Signed)
° ° ° °  If you have lab work done today you will be contacted with your lab results within the next 2 weeks.  If you have not heard from us then please contact us. The fastest way to get your results is to register for My Chart. ° ° °IF you received an x-ray today, you will receive an invoice from [facility]. Please contact [facility] at [phone] with questions or concerns regarding your invoice.  ° °IF you received labwork today, you will receive an invoice from [facility]. Please contact [facility] at [phone] with questions or concerns regarding your invoice.  ° °Our billing staff will not be able to assist you with questions regarding bills from these companies. ° °You will be contacted with the lab results as soon as they are available. The fastest way to get your results is to activate your My Chart account. Instructions are located on the last page of this paperwork. If you have not heard from us regarding the results in 2 weeks, please contact this office. °  ° ° ° °

## 2020-07-28 ENCOUNTER — Encounter (HOSPITAL_COMMUNITY): Payer: Self-pay | Admitting: *Deleted

## 2020-07-28 ENCOUNTER — Other Ambulatory Visit: Payer: Self-pay

## 2020-07-28 ENCOUNTER — Emergency Department (HOSPITAL_COMMUNITY)
Admission: EM | Admit: 2020-07-28 | Discharge: 2020-07-28 | Disposition: A | Discharge status: Home / Self Care | Attending: Emergency Medicine | Admitting: Emergency Medicine

## 2020-07-28 DIAGNOSIS — R109 Unspecified abdominal pain: Secondary | ICD-10-CM | POA: Diagnosis present

## 2020-07-28 DIAGNOSIS — R310 Gross hematuria: Secondary | ICD-10-CM

## 2020-07-28 DIAGNOSIS — F17210 Nicotine dependence, cigarettes, uncomplicated: Secondary | ICD-10-CM | POA: Diagnosis not present

## 2020-07-28 DIAGNOSIS — N23 Unspecified renal colic: Secondary | ICD-10-CM | POA: Insufficient documentation

## 2020-07-28 LAB — URINALYSIS, ROUTINE W REFLEX MICROSCOPIC
Bacteria, UA: NONE SEEN
Bilirubin Urine: NEGATIVE
Glucose, UA: NEGATIVE mg/dL
Ketones, ur: NEGATIVE mg/dL
Leukocytes,Ua: NEGATIVE
Nitrite: NEGATIVE
Protein, ur: 100 mg/dL — AB
RBC / HPF: >50 RBC/hpf — ABNORMAL HIGH (ref 0–5)
Specific Gravity, Urine: 1.017 (ref 1.005–1.030)
pH: 6 (ref 5.0–8.0)

## 2020-07-28 MED ORDER — HYDROCODONE-ACETAMINOPHEN 5-325 MG PO TABS: 1.0000 | ORAL_TABLET | ORAL | 0 refills | Status: DC | PRN

## 2020-07-28 MED ORDER — KETOROLAC TROMETHAMINE 60 MG/2ML IM SOLN
60.0000 mg | Freq: Once | INTRAMUSCULAR | Status: AC
  Administered 2020-07-28: 60 mg via INTRAMUSCULAR
  Filled 2020-07-28: qty 2

## 2020-07-28 MED ORDER — ACETAMINOPHEN 500 MG PO TABS
1000.0000 mg | ORAL_TABLET | Freq: Once | ORAL | Status: AC
  Administered 2020-07-28: 1000 mg via ORAL
  Filled 2020-07-28: qty 2

## 2020-07-28 MED ORDER — ONDANSETRON 4 MG PO TBDP
4.0000 mg | ORAL_TABLET | Freq: Once | ORAL | Status: AC
  Administered 2020-07-28: 4 mg via ORAL
  Filled 2020-07-28: qty 1

## 2020-07-28 MED ORDER — ONDANSETRON 4 MG PO TBDP: ORAL_TABLET | ORAL | 0 refills | Status: DC

## 2020-07-28 NOTE — Discharge Instructions (Addendum)
Use Zofran as needed for nausea and vomiting. Use ibuprofen 600 mg every 6 hours as needed for pain, Tylenol every 4 hours needed for pain. For severe pain that is not resolved with the above regimen pain. For severe pain take norco or vicodin however realize they have the potential for addiction and it can make you sleepy and has tylenol in it.  No operating machinery while taking. Follow-up with urology as discussed.  Return for fevers, uncontrolled vomiting, uncontrolled pain or new concerns.

## 2020-07-28 NOTE — ED Triage Notes (Signed)
Left flank pain

## 2020-07-30 ENCOUNTER — Other Ambulatory Visit: Payer: Self-pay

## 2020-07-30 ENCOUNTER — Emergency Department (HOSPITAL_COMMUNITY)
Admission: EM | Admit: 2020-07-30 | Discharge: 2020-07-30 | Disposition: A | Discharge status: Home / Self Care | Attending: Emergency Medicine | Admitting: Emergency Medicine

## 2020-07-30 ENCOUNTER — Emergency Department (HOSPITAL_COMMUNITY)

## 2020-07-30 DIAGNOSIS — F17210 Nicotine dependence, cigarettes, uncomplicated: Secondary | ICD-10-CM | POA: Insufficient documentation

## 2020-07-30 DIAGNOSIS — D72829 Elevated white blood cell count, unspecified: Secondary | ICD-10-CM | POA: Insufficient documentation

## 2020-07-30 DIAGNOSIS — N202 Calculus of kidney with calculus of ureter: Secondary | ICD-10-CM | POA: Diagnosis not present

## 2020-07-30 DIAGNOSIS — R109 Unspecified abdominal pain: Secondary | ICD-10-CM | POA: Diagnosis present

## 2020-07-30 DIAGNOSIS — N23 Unspecified renal colic: Secondary | ICD-10-CM

## 2020-07-30 LAB — COMPREHENSIVE METABOLIC PANEL
ALT: 43 U/L (ref 0–44)
AST: 29 U/L (ref 15–41)
Albumin: 4.4 g/dL (ref 3.5–5.0)
Alkaline Phosphatase: 115 U/L (ref 38–126)
Anion gap: 12 (ref 5–15)
BUN: 14 mg/dL (ref 6–20)
CO2: 25 mmol/L (ref 22–32)
Calcium: 9.6 mg/dL (ref 8.9–10.3)
Chloride: 104 mmol/L (ref 98–111)
Creatinine, Ser: 1.05 mg/dL — ABNORMAL HIGH (ref 0.44–1.00)
GFR, Estimated: >60 mL/min (ref >60)
Glucose, Bld: 114 mg/dL — ABNORMAL HIGH (ref 70–99)
Potassium: 3.8 mmol/L (ref 3.5–5.1)
Sodium: 141 mmol/L (ref 135–145)
Total Bilirubin: 0.6 mg/dL (ref 0.3–1.2)
Total Protein: 7.6 g/dL (ref 6.5–8.1)

## 2020-07-30 LAB — CBC WITH DIFFERENTIAL/PLATELET
Abs Immature Granulocytes: 0.12 10*3/uL — ABNORMAL HIGH (ref 0.00–0.07)
Basophils Absolute: 0.1 10*3/uL (ref 0.0–0.1)
Basophils Relative: 0 %
Eosinophils Absolute: 0.1 10*3/uL (ref 0.0–0.5)
Eosinophils Relative: 0 %
HCT: 44 % (ref 36.0–46.0)
Hemoglobin: 14.9 g/dL (ref 12.0–15.0)
Immature Granulocytes: 1 %
Lymphocytes Relative: 17 %
Lymphs Abs: 3.5 10*3/uL (ref 0.7–4.0)
MCH: 31.6 pg (ref 26.0–34.0)
MCHC: 33.9 g/dL (ref 30.0–36.0)
MCV: 93.4 fL (ref 80.0–100.0)
Monocytes Absolute: 1.7 10*3/uL — ABNORMAL HIGH (ref 0.1–1.0)
Monocytes Relative: 8 %
Neutro Abs: 15.4 10*3/uL — ABNORMAL HIGH (ref 1.7–7.7)
Neutrophils Relative %: 74 %
Platelets: 297 10*3/uL (ref 150–400)
RBC: 4.71 MIL/uL (ref 3.87–5.11)
RDW: 13.1 % (ref 11.5–15.5)
WBC: 20.9 10*3/uL — ABNORMAL HIGH (ref 4.0–10.5)
nRBC: 0 % (ref 0.0–0.2)

## 2020-07-30 LAB — URINALYSIS, ROUTINE W REFLEX MICROSCOPIC
Bilirubin Urine: NEGATIVE
Glucose, UA: NEGATIVE mg/dL
Ketones, ur: NEGATIVE mg/dL
Leukocytes,Ua: NEGATIVE
Nitrite: NEGATIVE
Protein, ur: NEGATIVE mg/dL
RBC / HPF: >50 RBC/hpf — ABNORMAL HIGH (ref 0–5)
Specific Gravity, Urine: 1.008 (ref 1.005–1.030)
pH: 8 (ref 5.0–8.0)

## 2020-07-30 LAB — PREGNANCY, URINE: Preg Test, Ur: NEGATIVE

## 2020-07-30 LAB — I-STAT BETA HCG BLOOD, ED (MC, WL, AP ONLY): I-stat hCG, quantitative: 5.1 m[IU]/mL — ABNORMAL HIGH (ref <5)

## 2020-07-30 LAB — HCG, QUANTITATIVE, PREGNANCY: hCG, Beta Chain, Quant, S: 4 m[IU]/mL (ref <5)

## 2020-07-30 MED ORDER — SODIUM CHLORIDE 0.9 % IV BOLUS
1000.0000 mL | Freq: Once | INTRAVENOUS | Status: AC
  Administered 2020-07-30: 1000 mL via INTRAVENOUS

## 2020-07-30 MED ORDER — ONDANSETRON HCL 4 MG/2ML IJ SOLN
4.0000 mg | Freq: Once | INTRAMUSCULAR | Status: AC
  Administered 2020-07-30: 4 mg via INTRAVENOUS
  Filled 2020-07-30: qty 2

## 2020-07-30 MED ORDER — KETOROLAC TROMETHAMINE 30 MG/ML IJ SOLN
30.0000 mg | Freq: Once | INTRAMUSCULAR | Status: AC
  Administered 2020-07-30: 30 mg via INTRAVENOUS
  Filled 2020-07-30: qty 1

## 2020-07-30 MED ORDER — OXYCODONE-ACETAMINOPHEN 5-325 MG PO TABS: 1.0000 | ORAL_TABLET | Freq: Four times a day (QID) | ORAL | 0 refills | Status: DC | PRN

## 2020-07-30 MED ORDER — MORPHINE SULFATE (PF) 4 MG/ML IV SOLN
4.0000 mg | Freq: Once | INTRAVENOUS | Status: AC
  Administered 2020-07-30: 4 mg via INTRAVENOUS
  Filled 2020-07-30: qty 1

## 2020-07-30 MED ORDER — TAMSULOSIN HCL 0.4 MG PO CAPS: 0.4000 mg | ORAL_CAPSULE | Freq: Every day | ORAL | 0 refills | Status: DC

## 2020-07-30 NOTE — Discharge Instructions (Addendum)
Continue taking Tylenol or Motrin for pain  Take Percocet instead of Vicodin for severe pain  You need to take Flomax daily.  Your Favor blood cell count is elevated likely from kidney stone.  Recommend repeat Adel blood cell count in a week with your doctor  Please call urology tomorrow for appointment  Return to ER if you have worse flank pain, abdominal pain, vomiting, fevers.

## 2020-07-30 NOTE — ED Provider Notes (Signed)
[facility] DEPT Provider Note   CSN: [id number] Arrival date & time: 07/30/20  1620     History Chief Complaint  Patient presents with   Flank Pain    Lindsay Ross is a 40 y.o. female hx of kidney stone, here with flank pain. Patient states that she was seen at [facility] 2 days ago and was diagnosed with renal colic.  She states that no imaging was done because she has a history of kidney stones and was assumed to have kidney stone.  At that time her urinalysis showed hematuria but no infection.  Patient was discharged on hydrocodone and Zofran.  She states that the hydrocodone is not controlling her pain.  She states that she has some nausea but no vomiting or fevers.  She had a previous kidney stone that passed without any intervention  The history is provided by the patient.      Past Medical History:  Diagnosis Date   History of chickenpox    History of nephrolithiasis     Patient Active Problem List   Diagnosis Date Noted   Anxiety disorder 11/08/2018   Fibromyalgia 11/08/2018    Past Surgical History:  Procedure Laterality Date   ABDOMINAL HYSTERECTOMY  2015   No ovaries, no uterus   CESAREAN SECTION  2014   TOTAL ABDOMINAL HYSTERECTOMY W/ BILATERAL SALPINGOOPHORECTOMY  2014     OB History   No obstetric history on file.     Family History  Problem Relation Age of Onset   Cancer Mother 64       Lung - smoker   COPD Mother    Depression Mother    Drug abuse Mother    Early death Mother    Heart disease Mother    Pulmonary embolism Mother    Cancer Father        RCC   Hyperlipidemia Father    Hypertension Father    Miscarriages / Stillbirths Sister    Asthma Brother    Alcohol abuse Maternal Grandmother    Alcohol abuse Maternal Grandfather    Cancer Paternal Grandmother        Breast   COPD Paternal 19 / Stillbirths Paternal Grandmother    Depression Paternal Grandfather    Heart  disease Paternal Grandfather    Heart attack Paternal Grandfather    Hypertension Paternal Grandfather    Hearing loss Paternal Grandfather     Social History   Tobacco Use   Smoking status: Every Day    Packs/day: 0.50    Types: Cigarettes   Smokeless tobacco: Never   Tobacco comments:    10-15 cigs per day  Vaping Use   Vaping Use: Never used  Substance Use Topics   Alcohol use: Never   Drug use: Not Currently    Home Medications Prior to Admission medications   Medication Sig Start Date End Date Taking? Authorizing Provider  buPROPion (WELLBUTRIN XL) 150 MG 24 hr tablet Take 150 mg by mouth daily.    [provider]  busPIRone (BUSPAR) 15 MG tablet Take 15 mg by mouth 2 (two) times daily. 03/14/20   [provider]  diazepam (VALIUM) 5 MG tablet Take 1-2 tablets by mouth twice daily as needed 10/25/18   [provider]  escitalopram (LEXAPRO) 20 MG tablet Take 20 mg by mouth daily. 07/29/19   [provider]  gabapentin (NEURONTIN) 300 MG capsule Take 1 capsule by mouth at noon. Take  3 capsules by mouth in the evening. 01/08/20   Brunetta Jeans, PA-C  HYDROcodone-acetaminophen (NORCO) 5-325 MG tablet Take 1-2 tablets by mouth every 4 (four) hours as needed. 07/28/20   Elnora Morrison, MD  Multiple Vitamin (MULTIVITAMIN) tablet Take 1 tablet by mouth daily.    [provider]  ondansetron (ZOFRAN ODT) 4 MG disintegrating tablet 4mg  ODT q4 hours prn nausea/vomit 07/28/20   Elnora Morrison, MD    Allergies    Patient has no known allergies.  Review of Systems   Review of Systems  Genitourinary:  Positive for flank pain.  All other systems reviewed and are negative.  Physical Exam Updated Vital Signs BP 127/81   Pulse 75   Temp 98.7 F (37.1 C) (Oral)   Resp 17   Ht 5\' 3"  (1.6 m)   Wt 77.1 kg   SpO2 95%   BMI 30.11 kg/m   Physical Exam Vitals and nursing note reviewed.  Constitutional:      Comments: Slightly  uncomfortable   HENT:     Head: Normocephalic.     Nose: Nose normal.     Mouth/Throat:     Mouth: Mucous membranes are moist.  Eyes:     Extraocular Movements: Extraocular movements intact.     Pupils: Pupils are equal, round, and reactive to light.  Cardiovascular:     Rate and Rhythm: Normal rate and regular rhythm.     Pulses: Normal pulses.     Heart sounds: Normal heart sounds.  Pulmonary:     Effort: Pulmonary effort is normal.     Breath sounds: Normal breath sounds.  Abdominal:     General: Abdomen is flat.     Palpations: Abdomen is soft.     Comments: + L CVAT   Musculoskeletal:     Cervical back: Normal range of motion and neck supple.  Skin:    General: Skin is warm.     Capillary Refill: Capillary refill takes less than 2 seconds.  Neurological:     General: No focal deficit present.     Mental Status: She is oriented to person, place, and time.  Psychiatric:        Mood and Affect: Mood normal.        Behavior: Behavior normal.    ED Results / Procedures / Treatments   Labs (all labs ordered are listed, but only abnormal results are displayed) Labs Reviewed  CBC WITH DIFFERENTIAL/PLATELET - Abnormal; Notable for the following components:      Result Value   WBC 20.9 (*)    Neutro Abs 15.4 (*)    Monocytes Absolute 1.7 (*)    Abs Immature Granulocytes 0.12 (*)    All other components within normal limits  COMPREHENSIVE METABOLIC PANEL - Abnormal; Notable for the following components:   Glucose, Bld 114 (*)    Creatinine, Ser 1.05 (*)    All other components within normal limits  URINALYSIS, ROUTINE W REFLEX MICROSCOPIC - Abnormal; Notable for the following components:   APPearance HAZY (*)    Hgb urine dipstick LARGE (*)    RBC / HPF >50 (*)    Bacteria, UA RARE (*)    All other components within normal limits  I-STAT BETA HCG BLOOD, ED ([facility], [facility], [facility] ONLY) - Abnormal; Notable for the following components:   I-stat hCG, quantitative 5.1 (*)    All  other components within normal limits  URINE CULTURE  HCG, QUANTITATIVE, PREGNANCY  PREGNANCY, URINE  EKG None  Radiology CT Renal Stone Study  Result Date: 07/30/2020 CLINICAL DATA:  Left flank pain, history of kidney stones EXAM: CT ABDOMEN AND PELVIS WITHOUT CONTRAST TECHNIQUE: Multidetector CT imaging of the abdomen and pelvis was performed following the standard protocol without IV contrast. COMPARISON:  MRI abdomen dated 04/15/2020. CT abdomen/pelvis dated 03/16/2020. FINDINGS: Lower chest: Mild dependent atelectasis in the bilateral lower lobes. Hepatobiliary: Severe hepatic steatosis with focal fatty sparing along the gallbladder fossa. Gallbladder is unremarkable. No intrahepatic or extrahepatic ductal dilatation. Pancreas: Within normal limits. Spleen: Multiple low-density splenic lesions, compatible with hemangiomas on MR. Adrenals/Urinary Tract: Adrenal glands are within normal limits. 2 mm interpolar right renal calculus (coronal image 93). Mild right hydroureteronephrosis. Associated 4 mm proximal right ureteral calculus at the L3-4 level. Three nonobstructing left renal calculi, measuring up to 3 mm in the left upper pole (coronal image 99). Mild left hydroureteronephrosis. Associated 4 mm distal left ureteral calculus above the UVJ (series 5/image 100). Bladder is within normal limits. Stomach/Bowel: Stomach is within normal limits. No evidence of bowel obstruction. Normal appendix (series 2/image 67). Vascular/Lymphatic: No evidence of abdominal aortic aneurysm. No suspicious abdominopelvic lymphadenopathy. Reproductive: Status post hysterectomy.  No adnexal masses. Other: No abdominopelvic ascites. Musculoskeletal: Visualized osseous structures are within normal limits. IMPRESSION: 4 mm proximal right ureteral calculus with mild right hydroureteronephrosis. 4 mm distal left ureteral calculus with mild left hydroureteronephrosis. Bilateral nonobstructing renal calculi, as above.  Electronically Signed   By: Julian Hy M.D.   On: 07/30/2020 19:34    Procedures Procedures   Medications Ordered in ED Medications  ketorolac (TORADOL) 30 MG/ML injection 30 mg (has no administration in time range)  sodium chloride 0.9 % bolus 1,000 mL (0 mLs Intravenous Stopped 07/30/20 1843)  ondansetron (ZOFRAN) injection 4 mg (4 mg Intravenous Given 07/30/20 1648)  morphine 4 MG/ML injection 4 mg (4 mg Intravenous Given 07/30/20 1652)  sodium chloride 0.9 % bolus 1,000 mL (1,000 mLs Intravenous New Bag/Given 07/30/20 1920)    ED Course  I have reviewed the triage vital signs and the nursing notes.  Pertinent labs & imaging results that were available during my care of the patient were reviewed by me and considered in my medical decision making (see chart for details).    MDM Rules/Calculators/A&P                          Biannca Scantlin is a 40 y.o. female here with L flank pain. Hx of kidney stone. Will get labs, UA, CT renal stone. Will give pain meds and reassess.   8:02 PM Patient's Granlund blood cell count is 20.  Her urinalysis did not show any signs of infection.  Urine culture is sent.  She does have right proximal ureteral stone with mild hydro and left distal ureteral stone with mild hydro-.  Patient's pain is under control right now. We will switch her from Vicodin to oxycodone.  We will also and Flomax.  I told her that she needs to see urology   Final Clinical Impression(s) / ED Diagnoses Final diagnoses:  None    Rx / DC Orders ED Discharge Orders     None        Drenda Freeze, MD 07/30/20 2003

## 2020-07-30 NOTE — ED Notes (Signed)
An After Visit Summary was printed and given to the patient. Discharge instructions given and no further questions at this time.  

## 2020-07-30 NOTE — ED Triage Notes (Signed)
Pt presents to ED via ems cc left flank pain. Pt states being DC from [facility] with prescription for pain meds for suspected kidney stone. Pt states hx of kidney stones. Per ems, pt was given 200 mcg of fentanyl IV enroute. Respirations equal, unlabored. A&Ox4.

## 2020-08-01 LAB — URINE CULTURE: Culture: <10000 — AB

## 2020-08-03 NOTE — ED Provider Notes (Signed)
[facility] EMERGENCY DEPARTMENT Provider Note   CSN: [id number] Arrival date & time: 07/28/20  1807     History Chief Complaint  Patient presents with   Flank Pain    Lindsay Ross is a 40 y.o. female.  Patient presents with left flank pain worsening for 24 hrs. Similar to previous kidney stones which she passed on her own. No fevers, or vomiting. No sob or injuries. NO urinary sxs. Located left flank.      Past Medical History:  Diagnosis Date   History of chickenpox    History of nephrolithiasis     Patient Active Problem List   Diagnosis Date Noted   Anxiety disorder 11/08/2018   Fibromyalgia 11/08/2018    Past Surgical History:  Procedure Laterality Date   ABDOMINAL HYSTERECTOMY  2015   No ovaries, no uterus   CESAREAN SECTION  2014   TOTAL ABDOMINAL HYSTERECTOMY W/ BILATERAL SALPINGOOPHORECTOMY  2014     OB History   No obstetric history on file.     Family History  Problem Relation Age of Onset   Cancer Mother 50       Lung - smoker   COPD Mother    Depression Mother    Drug abuse Mother    Early death Mother    Heart disease Mother    Pulmonary embolism Mother    Cancer Father        RCC   Hyperlipidemia Father    Hypertension Father    Miscarriages / Stillbirths Sister    Asthma Brother    Alcohol abuse Maternal Grandmother    Alcohol abuse Maternal Grandfather    Cancer Paternal Grandmother        Breast   COPD Paternal 39 / Stillbirths Paternal Grandmother    Depression Paternal Grandfather    Heart disease Paternal Grandfather    Heart attack Paternal Grandfather    Hypertension Paternal Grandfather    Hearing loss Paternal Grandfather     Social History   Tobacco Use   Smoking status: Every Day    Packs/day: 0.50    Types: Cigarettes   Smokeless tobacco: Never   Tobacco comments:    10-15 cigs per day  Vaping Use   Vaping Use: Never used  Substance Use Topics   Alcohol use: Never   Drug use:  Not Currently    Home Medications Prior to Admission medications   Medication Sig Start Date End Date Taking? Authorizing Provider  ondansetron (ZOFRAN ODT) 4 MG disintegrating tablet '4mg'$  ODT q4 hours prn nausea/vomit 07/28/20  Yes Elnora Morrison, MD  buPROPion (WELLBUTRIN XL) 150 MG 24 hr tablet Take 150 mg by mouth daily.    [provider]  busPIRone (BUSPAR) 15 MG tablet Take 15 mg by mouth 2 (two) times daily. 03/14/20   [provider]  diazepam (VALIUM) 5 MG tablet Take 1-2 tablets by mouth twice daily as needed 10/25/18   [provider]  escitalopram (LEXAPRO) 20 MG tablet Take 20 mg by mouth daily. 07/29/19   [provider]  gabapentin (NEURONTIN) 300 MG capsule Take 1 capsule by mouth at noon. Take 3 capsules by mouth in the evening. 01/08/20   Brunetta Jeans, PA-C  HYDROcodone-acetaminophen (NORCO) 5-325 MG tablet Take 1-2 tablets by mouth every 4 (four) hours as needed. 07/28/20   Elnora Morrison, MD  Multiple Vitamin (MULTIVITAMIN) tablet Take 1 tablet by mouth daily.    [provider]  oxyCODONE-acetaminophen (PERCOCET) [phone]  MG tablet Take 1 tablet by mouth every 6 (six) hours as needed. 07/30/20   Drenda Freeze, MD  tamsulosin (FLOMAX) 0.4 MG CAPS capsule Take 1 capsule (0.4 mg total) by mouth daily. 07/30/20   Drenda Freeze, MD    Allergies    Patient has no known allergies.  Review of Systems   Review of Systems  Constitutional:  Negative for chills and fever.  HENT:  Negative for congestion.   Eyes:  Negative for visual disturbance.  Respiratory:  Negative for shortness of breath.   Cardiovascular:  Negative for chest pain.  Gastrointestinal:  Negative for abdominal pain and vomiting.  Genitourinary:  Positive for flank pain. Negative for dysuria.  Musculoskeletal:  Negative for back pain, neck pain and neck stiffness.  Skin:  Negative for rash.  Neurological:  Negative for light-headedness and headaches.    Physical Exam Updated Vital Signs BP 132/77 (BP Location: Left Arm)   Pulse 94   Temp 97.9 F (36.6 C) (Oral)   Resp 16   SpO2 97%   Physical Exam Vitals and nursing note reviewed.  Constitutional:      General: She is not in acute distress.    Appearance: She is well-developed.  HENT:     Head: Normocephalic and atraumatic.     Mouth/Throat:     Mouth: Mucous membranes are moist.  Eyes:     General:        Right eye: No discharge.        Left eye: No discharge.     Conjunctiva/sclera: Conjunctivae normal.  Neck:     Trachea: No tracheal deviation.  Cardiovascular:     Rate and Rhythm: Normal rate.     Heart sounds: No murmur heard. Pulmonary:     Effort: Pulmonary effort is normal.  Abdominal:     General: There is no distension.     Palpations: Abdomen is soft.     Tenderness: There is no abdominal tenderness. There is no guarding.  Musculoskeletal:     Cervical back: Normal range of motion and neck supple. No rigidity.  Skin:    General: Skin is warm.     Capillary Refill: Capillary refill takes less than 2 seconds.     Findings: No rash.  Neurological:     General: No focal deficit present.     Mental Status: She is alert.     Cranial Nerves: No cranial nerve deficit.  Psychiatric:        Mood and Affect: Mood normal.    ED Results / Procedures / Treatments   Labs (all labs ordered are listed, but only abnormal results are displayed) Labs Reviewed  URINALYSIS, ROUTINE W REFLEX MICROSCOPIC - Abnormal; Notable for the following components:      Result Value   APPearance HAZY (*)    Hgb urine dipstick LARGE (*)    Protein, ur 100 (*)    RBC / HPF >50 (*)    All other components within normal limits    EKG None  Radiology No results found.  Procedures Procedures   Medications Ordered in ED Medications  ketorolac (TORADOL) injection 60 mg (60 mg Intramuscular Given 07/28/20 1950)  acetaminophen (TYLENOL) tablet 1,000 mg (1,000 mg Oral Given  07/28/20 1950)  ondansetron (ZOFRAN-ODT) disintegrating tablet 4 mg (4 mg Oral Given 07/28/20 1950)    ED Course  I have reviewed the triage vital signs and the nursing notes.  Pertinent labs & imaging results that were available  during my care of the patient were reviewed by me and considered in my medical decision making (see chart for details).    MDM Rules/Calculators/A&P                           Patient presents with clinical concern for left kidney stone. No resp sxs to suggest lung pathology, no injury to suggest traumatic.   Well appearing on exam, pain medicine ordered. UA reviewed showing blood without sign of infection. Discussed r/b of CT scan and agreed to hold as likely would not change management. Discussed follow up with urology and reasons to return.   Final Clinical Impression(s) / ED Diagnoses Final diagnoses:  Acute left flank pain  Gross hematuria  Ureteral colic    Rx / DC Orders ED Discharge Orders          Ordered    ondansetron (ZOFRAN ODT) 4 MG disintegrating tablet        07/28/20 2008    HYDROcodone-acetaminophen (NORCO) 5-325 MG tablet  Every 4 hours PRN,   Status:  Discontinued        07/28/20 2010    HYDROcodone-acetaminophen (NORCO) 5-325 MG tablet  Every 4 hours PRN        07/28/20 2013             Elnora Morrison, MD 08/03/20 1452

## 2020-08-05 ENCOUNTER — Encounter: Payer: Self-pay | Admitting: Registered Nurse

## 2020-08-07 ENCOUNTER — Encounter: Payer: Self-pay | Admitting: Registered Nurse

## 2020-08-07 ENCOUNTER — Other Ambulatory Visit: Payer: Self-pay

## 2020-08-07 ENCOUNTER — Ambulatory Visit (INDEPENDENT_AMBULATORY_CARE_PROVIDER_SITE_OTHER): Admitting: Registered Nurse

## 2020-08-07 VITALS — BP 119/84 | HR 68 | Temp 98.2°F | Resp 18 | Ht 63.0 in | Wt 179.4 lb

## 2020-08-07 DIAGNOSIS — Z09 Encounter for follow-up examination after completed treatment for conditions other than malignant neoplasm: Secondary | ICD-10-CM

## 2020-08-07 DIAGNOSIS — N200 Calculus of kidney: Secondary | ICD-10-CM | POA: Diagnosis not present

## 2020-08-07 DIAGNOSIS — M797 Fibromyalgia: Secondary | ICD-10-CM

## 2020-08-07 DIAGNOSIS — J9811 Atelectasis: Secondary | ICD-10-CM | POA: Diagnosis not present

## 2020-08-07 MED ORDER — ALBUTEROL SULFATE HFA 108 (90 BASE) MCG/ACT IN AERS: 2.0000 | INHALATION_SPRAY | Freq: Four times a day (QID) | RESPIRATORY_TRACT | 0 refills | Status: DC | PRN

## 2020-08-07 MED ORDER — GABAPENTIN 300 MG PO CAPS: ORAL_CAPSULE | ORAL | 5 refills | Status: DC

## 2020-08-07 MED ORDER — HYDROCODONE-ACETAMINOPHEN 5-325 MG PO TABS: 1.0000 | ORAL_TABLET | ORAL | 0 refills | Status: DC | PRN

## 2020-08-07 MED ORDER — TAMSULOSIN HCL 0.4 MG PO CAPS: 0.4000 mg | ORAL_CAPSULE | Freq: Every day | ORAL | 0 refills | Status: DC

## 2020-08-07 NOTE — Patient Instructions (Signed)
Lindsay Ross -   Good to see you, sorry you're not feeling well  Gabapentin refilled  Sent pain relievers and flomax - continue to take flomax daily, pain relieves as needed as directed  Urology referral placed. They will call you - hoping this afternoon or Monday morning.  If symptoms worsen, change, or anything drastic develops over the weekend, do not hesitate to see ER again   Thank you  Denice Paradise

## 2020-08-07 NOTE — Progress Notes (Signed)
Established Patient Office Visit  Subjective:  Patient ID: Lindsay Ross, female    DOB: 1980-09-23  Age: 40 y.o. MRN: [id number]  CC:  Chief Complaint  Patient presents with   Hospitalization Follow-up    Patient states she is here for hospital follow up for kidney stones. Patient states she is feeling a little better.    HPI Lindsay Ross presents for HFU  Seen on 07/28/20 and again on [id number] for renal colic Hx of nephrolithiases, felt to be similar pain. No imaging on initial presentation. On return, was started on Flomax and analgesics. Has continued on these. Pain has improved. Plans to follow up with urology as soon as available. No new symptoms or concerns.  Of note, in hospital labs showed CBC with WBC elevated to 20. Will recheck today.  Coincidental notation of atelectasis at both lung bases on CT renal stone study. No substantial respiratory history. She does note trouble getting a full, deep breath in at times but denies shob, wheezing, or coughing.  Needs refill on gabapentin. No AE. Tolerates well.   Past Medical History:  Diagnosis Date   History of chickenpox    History of nephrolithiasis     Past Surgical History:  Procedure Laterality Date   ABDOMINAL HYSTERECTOMY  2015   No ovaries, no uterus   CESAREAN SECTION  2014   TOTAL ABDOMINAL HYSTERECTOMY W/ BILATERAL SALPINGOOPHORECTOMY  2014    Family History  Problem Relation Age of Onset   Cancer Mother 66       Lung - smoker   COPD Mother    Depression Mother    Drug abuse Mother    Early death Mother    Heart disease Mother    Pulmonary embolism Mother    Cancer Father        RCC   Hyperlipidemia Father    Hypertension Father    Miscarriages / Stillbirths Sister    Asthma Brother    Alcohol abuse Maternal Grandmother    Alcohol abuse Maternal Grandfather    Cancer Paternal Grandmother        Breast   COPD Paternal 65 / Stillbirths Paternal Grandmother    Depression  Paternal Grandfather    Heart disease Paternal Grandfather    Heart attack Paternal Grandfather    Hypertension Paternal Grandfather    Hearing loss Paternal Grandfather     Social History   Socioeconomic History   Marital status: Married    Spouse name: Not on file   Number of children: 0   Years of education: Not on file   Highest education level: Not on file  Occupational History   Not on file  Tobacco Use   Smoking status: Every Day    Packs/day: 0.50    Types: Cigarettes   Smokeless tobacco: Never   Tobacco comments:    10-15 cigs per day  Vaping Use   Vaping Use: Never used  Substance and Sexual Activity   Alcohol use: Never   Drug use: Not Currently   Sexual activity: Yes    Birth control/protection: Surgical  Other Topics Concern   Not on file  Social History Narrative   Not on file   Social Determinants of Health   Financial Resource Strain: Not on file  Food Insecurity: Not on file  Transportation Needs: Not on file  Physical Activity: Not on file  Stress: Not on file  Social Connections: Not on file  Intimate Partner Violence: Not on  file    Outpatient Medications Prior to Visit  Medication Sig Dispense Refill   buPROPion (WELLBUTRIN XL) 150 MG 24 hr tablet Take 150 mg by mouth daily.     busPIRone (BUSPAR) 15 MG tablet Take 15 mg by mouth 2 (two) times daily.     diazepam (VALIUM) 5 MG tablet Take 1-2 tablets by mouth twice daily as needed     escitalopram (LEXAPRO) 20 MG tablet Take 20 mg by mouth daily.     Multiple Vitamin (MULTIVITAMIN) tablet Take 1 tablet by mouth daily.     ondansetron (ZOFRAN ODT) 4 MG disintegrating tablet '4mg'$  ODT q4 hours prn nausea/vomit 10 tablet 0   gabapentin (NEURONTIN) 300 MG capsule Take 1 capsule by mouth at noon. Take 3 capsules by mouth in the evening. 120 capsule 5   HYDROcodone-acetaminophen (NORCO) 5-325 MG tablet Take 1-2 tablets by mouth every 4 (four) hours as needed. 10 tablet 0   tamsulosin (FLOMAX) 0.4  MG CAPS capsule Take 1 capsule (0.4 mg total) by mouth daily. 10 capsule 0   oxyCODONE-acetaminophen (PERCOCET) 5-325 MG tablet Take 1 tablet by mouth every 6 (six) hours as needed. (Patient not taking: Reported on 08/07/2020) 10 tablet 0   No facility-administered medications prior to visit.    Not on File  ROS Review of Systems  Constitutional: Negative.   HENT: Negative.    Eyes: Negative.   Respiratory: Negative.    Cardiovascular: Negative.   Gastrointestinal: Negative.   Genitourinary:  Positive for flank pain.  Skin: Negative.   Neurological: Negative.   Psychiatric/Behavioral: Negative.    All other systems reviewed and are negative.    Objective:    Physical Exam Vitals and nursing note reviewed.  Constitutional:      General: She is not in acute distress.    Appearance: Normal appearance. She is normal weight. She is not ill-appearing, toxic-appearing or diaphoretic.  Cardiovascular:     Rate and Rhythm: Normal rate and regular rhythm.     Heart sounds: Normal heart sounds. No murmur heard.   No friction rub. No gallop.  Pulmonary:     Effort: Pulmonary effort is normal. No respiratory distress.     Breath sounds: Normal breath sounds. No stridor. No wheezing, rhonchi or rales.  Chest:     Chest wall: No tenderness.  Abdominal:     Tenderness: There is right CVA tenderness and left CVA tenderness.  Musculoskeletal:        General: Normal range of motion.  Skin:    General: Skin is warm and dry.  Neurological:     General: No focal deficit present.     Mental Status: She is alert and oriented to person, place, and time. Mental status is at baseline.  Psychiatric:        Mood and Affect: Mood normal.        Behavior: Behavior normal.        Thought Content: Thought content normal.        Judgment: Judgment normal.    BP 119/84   Pulse 68   Temp 98.2 F (36.8 C) (Temporal)   Resp 18   Ht '5\' 3"'$  (1.6 m)   Wt 179 lb 6.4 oz (81.4 kg)   SpO2 99%   BMI  31.78 kg/m  Wt Readings from Last 3 Encounters:  08/07/20 179 lb 6.4 oz (81.4 kg)  07/30/20 170 lb (77.1 kg)  04/06/20 182 lb (82.6 kg)     There are no  preventive care reminders to display for this patient.  There are no preventive care reminders to display for this patient.  Lab Results  Component Value Date   TSH 1.534 12/31/2018   Lab Results  Component Value Date   WBC 20.9 (H) 07/30/2020   HGB 14.9 07/30/2020   HCT 44.0 07/30/2020   MCV 93.4 07/30/2020   PLT 297 07/30/2020   Lab Results  Component Value Date   NA 141 07/30/2020   K 3.8 07/30/2020   CO2 25 07/30/2020   GLUCOSE 114 (H) 07/30/2020   BUN 14 07/30/2020   CREATININE 1.05 (H) 07/30/2020   BILITOT 0.6 07/30/2020   ALKPHOS 115 07/30/2020   AST 29 07/30/2020   ALT 43 07/30/2020   PROT 7.6 07/30/2020   ALBUMIN 4.4 07/30/2020   CALCIUM 9.6 07/30/2020   ANIONGAP 12 07/30/2020   GFR 99.30 11/08/2018   Lab Results  Component Value Date   CHOL 228 (H) 11/08/2018   Lab Results  Component Value Date   HDL 48.70 11/08/2018   No results found for: [facility] Lab Results  Component Value Date   TRIG 333.0 (H) 11/08/2018   Lab Results  Component Value Date   CHOLHDL 5 11/08/2018   Lab Results  Component Value Date   HGBA1C 5.6 11/08/2018      Assessment & Plan:   Problem List Items Addressed This Visit       Other   Fibromyalgia   Relevant Medications   HYDROcodone-acetaminophen (NORCO) 5-325 MG tablet   gabapentin (NEURONTIN) 300 MG capsule   Other Visit Diagnoses     Renal calculi    -  Primary   Relevant Medications   HYDROcodone-acetaminophen (NORCO) 5-325 MG tablet   tamsulosin (FLOMAX) 0.4 MG CAPS capsule   Other Relevant Orders   Ambulatory referral to Urology   Hospital discharge follow-up       Atelectasis of both lungs       Relevant Medications   albuterol (VENTOLIN HFA) 108 (90 Base) MCG/ACT inhaler       Meds ordered this encounter  Medications    HYDROcodone-acetaminophen (NORCO) 5-325 MG tablet    Sig: Take 1-2 tablets by mouth every 4 (four) hours as needed.    Dispense:  20 tablet    Refill:  0    Order Specific Question:   Supervising Provider    Answer:   Carlota Raspberry, JEFFREY R [[id number]]   gabapentin (NEURONTIN) 300 MG capsule    Sig: Take 1 capsule by mouth at noon. Take 3 capsules by mouth in the evening.    Dispense:  120 capsule    Refill:  5    Order Specific Question:   Supervising Provider    Answer:   Carlota Raspberry, JEFFREY R [[id number]]   tamsulosin (FLOMAX) 0.4 MG CAPS capsule    Sig: Take 1 capsule (0.4 mg total) by mouth daily.    Dispense:  10 capsule    Refill:  0    Order Specific Question:   Supervising Provider    Answer:   Carlota Raspberry, JEFFREY R [[id number]]   albuterol (VENTOLIN HFA) 108 (90 Base) MCG/ACT inhaler    Sig: Inhale 2 puffs into the lungs every 6 (six) hours as needed for wheezing or shortness of breath.    Dispense:  8 g    Refill:  0    Order Specific Question:   Supervising Provider    Answer:   Carlota Raspberry, JEFFREY R [[id number]]    Follow-up: Return if symptoms  worsen or fail to improve.   PLAN Refill analgesic and flomax as above Discussed risk for hydronephrosis and ER precaution Urgent referral to urology - first available - would prefer [place] office but amenable to any Refill gabapentin as above Will give albuterol for atelectasis noted. Reviewed risks/benefits of this medication as well as side effects.  Patient encouraged to call clinic with any questions, comments, or concerns.  Maximiano Coss, NP

## 2020-08-11 ENCOUNTER — Encounter: Payer: Self-pay | Admitting: Registered Nurse

## 2020-08-11 NOTE — Telephone Encounter (Signed)
Followed up on by referral coordinator and patient was contacted with information.

## 2020-08-17 ENCOUNTER — Ambulatory Visit (INDEPENDENT_AMBULATORY_CARE_PROVIDER_SITE_OTHER): Admitting: Gastroenterology

## 2020-08-20 ENCOUNTER — Other Ambulatory Visit: Payer: Self-pay

## 2020-08-20 ENCOUNTER — Encounter (HOSPITAL_COMMUNITY): Payer: Self-pay | Admitting: Emergency Medicine

## 2020-08-20 ENCOUNTER — Emergency Department (HOSPITAL_COMMUNITY)
Admission: EM | Admit: 2020-08-20 | Discharge: 2020-08-20 | Disposition: A | Discharge status: Home / Self Care | Attending: Emergency Medicine | Admitting: Emergency Medicine

## 2020-08-20 DIAGNOSIS — F17210 Nicotine dependence, cigarettes, uncomplicated: Secondary | ICD-10-CM | POA: Diagnosis not present

## 2020-08-20 DIAGNOSIS — R109 Unspecified abdominal pain: Secondary | ICD-10-CM

## 2020-08-20 DIAGNOSIS — R1031 Right lower quadrant pain: Secondary | ICD-10-CM | POA: Insufficient documentation

## 2020-08-20 LAB — COMPREHENSIVE METABOLIC PANEL
ALT: 44 U/L (ref 0–44)
AST: 31 U/L (ref 15–41)
Albumin: 4.4 g/dL (ref 3.5–5.0)
Alkaline Phosphatase: 89 U/L (ref 38–126)
Anion gap: 14 (ref 5–15)
BUN: 14 mg/dL (ref 6–20)
CO2: 23 mmol/L (ref 22–32)
Calcium: 9.9 mg/dL (ref 8.9–10.3)
Chloride: 104 mmol/L (ref 98–111)
Creatinine, Ser: 0.78 mg/dL (ref 0.44–1.00)
GFR, Estimated: >60 mL/min (ref >60)
Glucose, Bld: 119 mg/dL — ABNORMAL HIGH (ref 70–99)
Potassium: 3.7 mmol/L (ref 3.5–5.1)
Sodium: 141 mmol/L (ref 135–145)
Total Bilirubin: 0.6 mg/dL (ref 0.3–1.2)
Total Protein: 7.8 g/dL (ref 6.5–8.1)

## 2020-08-20 LAB — CBC WITH DIFFERENTIAL/PLATELET
Abs Immature Granulocytes: 0.14 10*3/uL — ABNORMAL HIGH (ref 0.00–0.07)
Basophils Absolute: 0.1 10*3/uL (ref 0.0–0.1)
Basophils Relative: 0 %
Eosinophils Absolute: 0.1 10*3/uL (ref 0.0–0.5)
Eosinophils Relative: 1 %
HCT: 44.7 % (ref 36.0–46.0)
Hemoglobin: 14.9 g/dL (ref 12.0–15.0)
Immature Granulocytes: 1 %
Lymphocytes Relative: 23 %
Lymphs Abs: 5 10*3/uL — ABNORMAL HIGH (ref 0.7–4.0)
MCH: 31.2 pg (ref 26.0–34.0)
MCHC: 33.3 g/dL (ref 30.0–36.0)
MCV: 93.5 fL (ref 80.0–100.0)
Monocytes Absolute: 1.6 10*3/uL — ABNORMAL HIGH (ref 0.1–1.0)
Monocytes Relative: 7 %
Neutro Abs: 15.1 10*3/uL — ABNORMAL HIGH (ref 1.7–7.7)
Neutrophils Relative %: 68 %
Platelets: 308 10*3/uL (ref 150–400)
RBC: 4.78 MIL/uL (ref 3.87–5.11)
RDW: 12.9 % (ref 11.5–15.5)
WBC: 22.1 10*3/uL — ABNORMAL HIGH (ref 4.0–10.5)
nRBC: 0 % (ref 0.0–0.2)

## 2020-08-20 MED ORDER — FENTANYL CITRATE (PF) 100 MCG/2ML IJ SOLN
50.0000 ug | Freq: Once | INTRAMUSCULAR | Status: AC
  Administered 2020-08-20: 50 ug via INTRAVENOUS
  Filled 2020-08-20: qty 2

## 2020-08-20 MED ORDER — KETOROLAC TROMETHAMINE 60 MG/2ML IM SOLN
60.0000 mg | Freq: Once | INTRAMUSCULAR | Status: AC
  Administered 2020-08-20: 60 mg via INTRAMUSCULAR
  Filled 2020-08-20: qty 2

## 2020-08-20 MED ORDER — HYDROMORPHONE HCL 1 MG/ML IJ SOLN
1.0000 mg | Freq: Once | INTRAMUSCULAR | Status: AC
  Administered 2020-08-20: 1 mg via INTRAVENOUS
  Filled 2020-08-20: qty 1

## 2020-08-20 MED ORDER — SODIUM CHLORIDE 0.9 % IV BOLUS
1000.0000 mL | Freq: Once | INTRAVENOUS | Status: AC
  Administered 2020-08-20: 1000 mL via INTRAVENOUS

## 2020-08-20 MED ORDER — KETOROLAC TROMETHAMINE 30 MG/ML IJ SOLN: 30.0000 mg | Freq: Once | INTRAMUSCULAR | Status: DC

## 2020-08-20 MED ORDER — ONDANSETRON HCL 4 MG/2ML IJ SOLN
4.0000 mg | Freq: Once | INTRAMUSCULAR | Status: AC
  Administered 2020-08-20: 4 mg via INTRAVENOUS
  Filled 2020-08-20: qty 2

## 2020-08-20 NOTE — ED Triage Notes (Signed)
Pt had lithotripsy and is still having sever right side flank pain. Pt has taken prescribed pain medications with no relief.

## 2020-08-20 NOTE — ED Provider Notes (Signed)
[place] DEPT Provider Note   CSN: [id number] Arrival date & time: 08/20/20  [id number]     History Chief Complaint  Patient presents with   Flank Pain    Right    Lindsay Ross is a 40 y.o. female.  Lindsay Ross presented with right-sided flank pain radiating to her right abdomen.  She had lithotripsy performed by Dr. Gloriann Loan 4 days ago.  She states that pain occurred immediately after the procedure.  She had a CT scan yesterday which is not visible in the computer, but she was told there was some kind of inflammation around her kidney.  Today she is here with severe pain since at least 3 AM.  It is unrelieved by her narcotic medication.  The history is provided by the patient.  Flank Pain This is a recurrent problem. Episode onset: occurred after lithotripsy which was performed 4 days ago. The problem occurs constantly. The problem has been rapidly worsening. Associated symptoms include abdominal pain. Pertinent negatives include no chest pain, no headaches and no shortness of breath. Nothing aggravates the symptoms. Nothing relieves the symptoms. Treatments tried: hydrocodone, dilaudid. The treatment provided no relief.      Past Medical History:  Diagnosis Date   History of chickenpox    History of nephrolithiasis     Patient Active Problem List   Diagnosis Date Noted   Anxiety disorder 11/08/2018   Fibromyalgia 11/08/2018    Past Surgical History:  Procedure Laterality Date   ABDOMINAL HYSTERECTOMY  2015   No ovaries, no uterus   CESAREAN SECTION  2014   LITHOTRIPSY     TOTAL ABDOMINAL HYSTERECTOMY W/ BILATERAL SALPINGOOPHORECTOMY  2014     OB History   No obstetric history on file.     Family History  Problem Relation Age of Onset   Cancer Mother 6       Lung - smoker   COPD Mother    Depression Mother    Drug abuse Mother    Early death Mother    Heart disease Mother    Pulmonary embolism Mother    Cancer Father        RCC    Hyperlipidemia Father    Hypertension Father    Miscarriages / Stillbirths Sister    Asthma Brother    Alcohol abuse Maternal Grandmother    Alcohol abuse Maternal Grandfather    Cancer Paternal Grandmother        Breast   COPD Paternal 78 / Stillbirths Paternal Grandmother    Depression Paternal Grandfather    Heart disease Paternal Grandfather    Heart attack Paternal Grandfather    Hypertension Paternal Grandfather    Hearing loss Paternal Grandfather     Social History   Tobacco Use   Smoking status: Every Day    Packs/day: 0.50    Types: Cigarettes   Smokeless tobacco: Never   Tobacco comments:    10-15 cigs per day  Vaping Use   Vaping Use: Never used  Substance Use Topics   Alcohol use: Never   Drug use: Not Currently    Home Medications Prior to Admission medications   Medication Sig Start Date End Date Taking? Authorizing Provider  albuterol (VENTOLIN HFA) 108 (90 Base) MCG/ACT inhaler Inhale 2 puffs into the lungs every 6 (six) hours as needed for wheezing or shortness of breath. 08/07/20   Maximiano Coss, NP  buPROPion (WELLBUTRIN XL) 150 MG 24 hr tablet Take 150 mg  by mouth daily.    [provider]  busPIRone (BUSPAR) 15 MG tablet Take 15 mg by mouth 2 (two) times daily. 03/14/20   [provider]  diazepam (VALIUM) 5 MG tablet Take 1-2 tablets by mouth twice daily as needed 10/25/18   [provider]  escitalopram (LEXAPRO) 20 MG tablet Take 20 mg by mouth daily. 07/29/19   [provider]  gabapentin (NEURONTIN) 300 MG capsule Take 1 capsule by mouth at noon. Take 3 capsules by mouth in the evening. 08/07/20   Maximiano Coss, NP  HYDROcodone-acetaminophen (NORCO) 5-325 MG tablet Take 1-2 tablets by mouth every 4 (four) hours as needed. 08/07/20   Maximiano Coss, NP  Multiple Vitamin (MULTIVITAMIN) tablet Take 1 tablet by mouth daily.    [provider]  ondansetron (ZOFRAN ODT) 4 MG  disintegrating tablet '4mg'$  ODT q4 hours prn nausea/vomit 07/28/20   Elnora Morrison, MD  tamsulosin (FLOMAX) 0.4 MG CAPS capsule Take 1 capsule (0.4 mg total) by mouth daily. 08/07/20   Maximiano Coss, NP    Allergies    Patient has no known allergies.  Review of Systems   Review of Systems  Constitutional:  Negative for chills and fever.  HENT:  Negative for ear pain and sore throat.   Eyes:  Negative for pain and visual disturbance.  Respiratory:  Negative for cough and shortness of breath.   Cardiovascular:  Negative for chest pain and palpitations.  Gastrointestinal:  Positive for abdominal pain. Negative for vomiting.  Genitourinary:  Positive for flank pain. Negative for dysuria and hematuria.  Musculoskeletal:  Negative for arthralgias and back pain.  Skin:  Negative for color change and rash.  Neurological:  Negative for seizures, syncope and headaches.  All other systems reviewed and are negative.  Physical Exam Updated Vital Signs BP (!) 153/100 (BP Location: Right Arm)   Pulse 94   Temp 98.5 F (36.9 C) (Oral)   Resp 16   Ht '5\' 3"'$  (1.6 m)   Wt 77.1 kg   SpO2 97%   BMI 30.11 kg/m   Physical Exam Vitals and nursing note reviewed.  Constitutional:      Comments: Writhing in bed  HENT:     Head: Normocephalic and atraumatic.  Eyes:     General: No scleral icterus. Pulmonary:     Effort: Pulmonary effort is normal. No respiratory distress.  Abdominal:     Tenderness: There is right CVA tenderness. There is no left CVA tenderness.  Musculoskeletal:     Cervical back: Normal range of motion.     Right lower leg: No edema.     Left lower leg: No edema.  Skin:    General: Skin is warm and dry.  Neurological:     General: No focal deficit present.     Mental Status: She is alert and oriented to person, place, and time.  Psychiatric:        Mood and Affect: Mood normal.    ED Results / Procedures / Treatments   Labs (all labs ordered are listed, but only  abnormal results are displayed) Labs Reviewed  COMPREHENSIVE METABOLIC PANEL - Abnormal; Notable for the following components:      Result Value   Glucose, Bld 119 (*)    All other components within normal limits  CBC WITH DIFFERENTIAL/PLATELET - Abnormal; Notable for the following components:   WBC 22.1 (*)    Neutro Abs 15.1 (*)    Lymphs Abs 5.0 (*)  Monocytes Absolute 1.6 (*)    Abs Immature Granulocytes 0.14 (*)    All other components within normal limits  URINALYSIS, ROUTINE W REFLEX MICROSCOPIC    EKG None  Radiology No results found.  Procedures Procedures   Medications Ordered in ED Medications  sodium chloride 0.9 % bolus 1,000 mL (has no administration in time range)  HYDROmorphone (DILAUDID) injection 1 mg (has no administration in time range)  ondansetron (ZOFRAN) injection 4 mg (has no administration in time range)    ED Course  I have reviewed the triage vital signs and the nursing notes.  Pertinent labs & imaging results that were available during my care of the patient were reviewed by me and considered in my medical decision making (see chart for details).  Clinical Course as of 08/20/20 0935  Thu Aug 20, 2020  [id number] Spoke with Dr. Milford Cage.  He recommends Toradol IM 60 mg.  There is little benefit to be gained from repeat imaging as she had a CT scan yesterday.  There was no obstructing stone noted on the scan.  He reviewed operative notes and clinic notes. [AW]    Clinical Course User Index [AW] Arnaldo Natal, MD   MDM Rules/Calculators/A&P                           Lindsay Ross presents with right-sided flank pain after a urologic procedure 4 days ago.  She initially stated that this was lithotripsy, but after consultation with urology, I learned that it was ureteroscopy of both ureters.  She had a basket retrieval of a left-sided stone.  She was given medication for pain control, and she was comfortable upon discharge.  She is taking hydrocodone  10 mg every 4 hours.  She is not taking anti-inflammatories, and I counseled her to add ibuprofen to her pain regimen.  She is to consult her urologist.  She was unable to obtain a urine specimen, but she is being treated for a urinary tract infection after urine was collected in urology clinic yesterday.   Final Clinical Impression(s) / ED Diagnoses Final diagnoses:  Right flank pain    Rx / DC Orders ED Discharge Orders     None        Arnaldo Natal, MD 08/20/20 [phone]

## 2020-09-08 ENCOUNTER — Encounter (INDEPENDENT_AMBULATORY_CARE_PROVIDER_SITE_OTHER): Payer: Self-pay | Admitting: Gastroenterology

## 2020-09-08 ENCOUNTER — Other Ambulatory Visit: Payer: Self-pay

## 2020-09-08 ENCOUNTER — Ambulatory Visit (INDEPENDENT_AMBULATORY_CARE_PROVIDER_SITE_OTHER): Admitting: Gastroenterology

## 2020-09-08 DIAGNOSIS — D1803 Hemangioma of intra-abdominal structures: Secondary | ICD-10-CM | POA: Diagnosis not present

## 2020-09-08 DIAGNOSIS — K7581 Nonalcoholic steatohepatitis (NASH): Secondary | ICD-10-CM

## 2020-09-08 DIAGNOSIS — D1800 Hemangioma unspecified site: Secondary | ICD-10-CM | POA: Insufficient documentation

## 2020-09-08 NOTE — Progress Notes (Signed)
Maylon Peppers, M.D. Gastroenterology & Hepatology [facility] [address] [place], [place] [address] Primary Care Physician: Maximiano Coss, NP [address] [place] [address]  Referring MD: PCP  Chief Complaint: Fatty liver  History of Present Illness: Lindsay Ross is a 40 y.o. female with PMH recurrent kidney stones (possibly 6 times in the past), endometriosis s/p hysterectomy and BSO, who presents for evaluation of fatty liver.  Patient reports that in 2019 she used to live in [place] and was undergoing evaluation for kidney stones. 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  She states that she was given reassurance about this.  However, she has had multiple episodes of renal stones in the past.  Due to this, she underwent CT of the abdomen and pelvis without contrast on 03/16/2020 which showed a 2 mm obstructing renal stone at the left UVJ and presence of bilateral subcentimeter nonobstructing renal stones, but also presence of hepatic cytosis and lesions in the spleen.  Due to this she was recommended to undergo an MRI of the abdomen with and without IV contrast, which she performed on 04/15/2020.  She was found to have significant hepatic acidosis with hepatomegaly up to 20 cm and presence of a hepatic cyst measuring up to 2.2 cm.  No presence of portal hypertension changes.  Spleen had 3 lesions with largest measuring 2.1 cm consistent with splenic hemangiomata.  Due to these findings, the patient was referred by her PCP for further evaluation.  Upon review of the patient's medical chart, available labs since 2020 showed that she had an increase in her aminotransferases in July 2022 when the AST was 29 and ALT was 43, with most recent aminotransferases on August 20, 2020 showing an AST of 31 and ALT of 44.  Total bilirubin was 0.6 and alkaline phosphatase was 89 which were within normal limits.  The patient denies having any complaints such as nausea, vomiting, fever, chills, hematochezia, melena, hematemesis, abdominal distention, abdominal pain, diarrhea, jaundice, pruritus. She reports that she gained significant amount of weight (20 lb) in the last two years, but states recently she has been working on losing some and has lost close some for the last 9 lb.  Last [id number] Last Colonoscopy:never  FHx: neg for any gastrointestinal/liver disease, mother lung cancer, father kidney cancer, grandfather breast cancer Social: smokes 1 - 0.5 packs a day, neg alcohol or illicit drug use Surgical: c-section, hysterectomy and BSO, litotripsy  Past Medical History: Past Medical History:  Diagnosis Date   History of chickenpox    History of nephrolithiasis     Past Surgical History: Past Surgical History:  Procedure Laterality Date   ABDOMINAL HYSTERECTOMY  2015   No ovaries, no uterus   CESAREAN SECTION  2014   LITHOTRIPSY     TOTAL ABDOMINAL HYSTERECTOMY W/ BILATERAL SALPINGOOPHORECTOMY  2014    Family History: Family History  Problem Relation Age of Onset   Cancer Mother 71       Lung - smoker   COPD Mother    Depression Mother    Drug abuse Mother    Early death Mother    Heart disease Mother    Pulmonary embolism Mother    Cancer Father  RCC   Hyperlipidemia Father    Hypertension Father    Miscarriages / Stillbirths Sister    Asthma Brother    Alcohol abuse Maternal Grandmother    Alcohol abuse Maternal Grandfather    Cancer Paternal Grandmother        Breast   COPD Paternal Grandmother    Miscarriages / Stillbirths Paternal Grandmother    Depression Paternal Grandfather    Heart disease Paternal Grandfather    Heart attack Paternal Grandfather    Hypertension Paternal Grandfather    Hearing loss Paternal Grandfather      Social History: Social History   Tobacco Use  Smoking Status Every Day   Packs/day: 0.50   Types: Cigarettes  Smokeless Tobacco Never  Tobacco Comments   10-15 cigs per day   Social History   Substance and Sexual Activity  Alcohol Use Never   Social History   Substance and Sexual Activity  Drug Use Not Currently    Allergies: No Known Allergies  Medications: Current Outpatient Medications  Medication Sig Dispense Refill   albuterol (VENTOLIN HFA) 108 (90 Base) MCG/ACT inhaler Inhale 2 puffs into the lungs every 6 (six) hours as needed for wheezing or shortness of breath. 8 g 0   buPROPion (WELLBUTRIN XL) 150 MG 24 hr tablet Take 150 mg by mouth daily.     busPIRone (BUSPAR) 15 MG tablet Take 15 mg by mouth 2 (two) times daily.     diazepam (VALIUM) 5 MG tablet Take 1-2 tablets by mouth twice daily as needed     escitalopram (LEXAPRO) 20 MG tablet Take 20 mg by mouth daily.     gabapentin (NEURONTIN) 300 MG capsule Take 1 capsule by mouth at noon. Take 3 capsules by mouth in the evening. 120 capsule 5   HYDROcodone-acetaminophen (NORCO) 5-325 MG tablet Take 1-2 tablets by mouth every 4 (four) hours as needed. 20 tablet 0   Multiple Vitamin (MULTIVITAMIN) tablet Take 1 tablet by mouth daily.     ondansetron (ZOFRAN ODT) 4 MG disintegrating tablet '4mg'$  ODT q4 hours prn nausea/vomit 10 tablet 0   tamsulosin (FLOMAX) 0.4 MG CAPS capsule Take 1 capsule (0.4 mg total) by mouth daily. 10 capsule 0   No current facility-administered medications for this visit.    Review of Systems: GENERAL: negative for malaise, night sweats HEENT: No changes in hearing or vision, no nose bleeds or other nasal problems. NECK: Negative for lumps, goiter, pain and significant neck swelling RESPIRATORY: Negative for cough, wheezing CARDIOVASCULAR: Negative for chest pain, leg swelling, palpitations, orthopnea GI: SEE HPI MUSCULOSKELETAL: Negative for joint pain or swelling, back pain, and  muscle pain. SKIN: Negative for lesions, rash PSYCH: Negative for sleep disturbance, mood disorder and recent psychosocial stressors. HEMATOLOGY Negative for prolonged bleeding, bruising easily, and swollen nodes. ENDOCRINE: Negative for cold or heat intolerance, polyuria, polydipsia and goiter. NEURO: negative for tremor, gait imbalance, syncope and seizures. The remainder of the review of systems is noncontributory.   Physical Exam: BP 121/73 (BP Location: Left Arm, Patient Position: Sitting, Cuff Size: Large)   Pulse 98   Temp 98.3 F (36.8 C) (Oral)   Ht '5\' 3"'$  (1.6 m)   Wt 170 lb 6.4 oz (77.3 kg)   BMI 30.19 kg/m  GENERAL: The patient is AO x3, in no acute distress. HEENT: Head is normocephalic and atraumatic. EOMI are intact. Mouth is well hydrated and without lesions. NECK: Supple. No masses LUNGS: Clear to auscultation. No presence of rhonchi/wheezing/rales. Adequate chest expansion  HEART: RRR, normal s1 and s2. ABDOMEN: Soft, nontender, no guarding, no peritoneal signs, and nondistended. BS +. No masses. EXTREMITIES: Without any cyanosis, clubbing, rash, lesions or edema. NEUROLOGIC: AOx3, no focal motor deficit. SKIN: no jaundice, no rashes   Imaging/Labs: as above  I personally reviewed and interpreted the available labs, imaging and endoscopic files.  Impression and Plan: Lindsay Ross is a 40 y.o. female with PMH recurrent kidney stones (possibly 6 times in the past), endometriosis s/p hysterectomy and BSO, who presents for evaluation of fatty liver.  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  Importantly, she has been working on losing weight recently and has been successful with this.  I encouraged her to keep working on her dietary lifestyle changes and exercising often with a goal to lose 10% of her weight.  She will benefit from implementing a Mediterranean  diet.  Her NAFLD score is low (-2.5) which makes advised therefore was unlikely, no further imaging is warranted.  The patient understood and agreed.  Her splenic lesions have been stable in size and are consistent with splenic hemangiomata which do not require any current treatment.  She has presented chronic leukocytosis which is currently being followed by Dr. Lorenso Courier.  - Explained to the patient the etiology and consequences of his current liver disease. Patient was counseled about the benefit of implementing a Mediterranean diet and exercise plan to decrease at least 5% (ideally 10%) of weight. The patient understood about the importance of lifestyle changes to potentially reverse his liver involvement.  All questions were answered.      Maylon Peppers, MD Gastroenterology and Hepatology [facility]

## 2020-09-08 NOTE — Patient Instructions (Signed)
Explained to the patient the etiology and consequences of his current liver disease. Patient was counseled about the benefit of implementing a Mediterranean diet and exercise plan to decrease at least 5% (ideally 10%) of weight. The patient understood about the importance of lifestyle changes to potentially reverse his liver involvement.

## 2021-04-13 ENCOUNTER — Ambulatory Visit (INDEPENDENT_AMBULATORY_CARE_PROVIDER_SITE_OTHER): Admitting: Registered Nurse

## 2021-04-13 ENCOUNTER — Encounter: Payer: Self-pay | Admitting: Registered Nurse

## 2021-04-13 VITALS — BP 122/64 | HR 84 | Temp 98.2°F | Resp 16 | Ht 63.0 in | Wt 170.8 lb

## 2021-04-13 DIAGNOSIS — F411 Generalized anxiety disorder: Secondary | ICD-10-CM | POA: Diagnosis not present

## 2021-04-13 DIAGNOSIS — M797 Fibromyalgia: Secondary | ICD-10-CM

## 2021-04-13 DIAGNOSIS — J9811 Atelectasis: Secondary | ICD-10-CM | POA: Diagnosis not present

## 2021-04-13 MED ORDER — ESCITALOPRAM OXALATE 20 MG PO TABS: 20.0000 mg | ORAL_TABLET | Freq: Every day | ORAL | 1 refills | Status: DC

## 2021-04-13 MED ORDER — BUPROPION HCL ER (XL) 150 MG PO TB24: 150.0000 mg | ORAL_TABLET | Freq: Every day | ORAL | 1 refills | Status: DC

## 2021-04-13 MED ORDER — GABAPENTIN 300 MG PO CAPS: ORAL_CAPSULE | ORAL | 5 refills | Status: DC

## 2021-04-13 MED ORDER — ALBUTEROL SULFATE HFA 108 (90 BASE) MCG/ACT IN AERS: 2.0000 | INHALATION_SPRAY | Freq: Four times a day (QID) | RESPIRATORY_TRACT | 0 refills | Status: DC | PRN

## 2021-04-13 NOTE — Patient Instructions (Signed)
Ms. Stjames -  ? ?Great to see you! ? ?Meds refilled. Can refill for a year.  ? ?Let me know if things stop working or change ? ?Thanks, ? ?Rich  ?

## 2021-05-24 ENCOUNTER — Other Ambulatory Visit: Payer: Self-pay | Admitting: Registered Nurse

## 2021-05-24 DIAGNOSIS — F411 Generalized anxiety disorder: Secondary | ICD-10-CM

## 2021-05-31 DIAGNOSIS — J9811 Atelectasis: Secondary | ICD-10-CM | POA: Insufficient documentation

## 2021-05-31 NOTE — Assessment & Plan Note (Signed)
Stable on gabapentin. Refills given

## 2021-05-31 NOTE — Assessment & Plan Note (Signed)
Refill albuterol. Monitor effect. Refills prn.

## 2021-05-31 NOTE — Assessment & Plan Note (Signed)
Stable on current medications. Refills given. Recheck q6-12 mo.

## 2021-05-31 NOTE — Progress Notes (Signed)
Established Patient Office Visit  Subjective:  Patient ID: Lindsay Ross, female    DOB: 04/16/1980  Age: 41 y.o. MRN: [id number]  CC:  Chief Complaint  Patient presents with   Anxiety    Pt reports still periodically sees psychiatrist but has not seen them in some time wondered if you could take over lexapro and Wellbutrin    Medication Refill    Pt needs refill inhaler as well as gabapentin no concerns working well     HPI Lindsay Ross presents for medication refill  Anxiety Has been seeing psychiatry but very stable on wellbutrin and lexapro Would like to have these maintained through this office No AE, no hi/si.  Atelectasis Uses albuterol inhaler prn Not every day Good effect, no AE.  Fibromyalgia Gabapentin '300mg'$  po qd at noon, '900mg'$  po qhs. Good effect, no AE Has been on dose for some time. Hopes to continue.  Outpatient Medications Prior to Visit  Medication Sig Dispense Refill   busPIRone (BUSPAR) 15 MG tablet Take 15 mg by mouth 2 (two) times daily.     diazepam (VALIUM) 5 MG tablet Take 1-2 tablets by mouth twice daily as needed     HYDROcodone-acetaminophen (NORCO) 5-325 MG tablet Take 1-2 tablets by mouth every 4 (four) hours as needed. 20 tablet 0   Multiple Vitamin (MULTIVITAMIN) tablet Take 1 tablet by mouth daily.     ondansetron (ZOFRAN ODT) 4 MG disintegrating tablet '4mg'$  ODT q4 hours prn nausea/vomit 10 tablet 0   tamsulosin (FLOMAX) 0.4 MG CAPS capsule Take 1 capsule (0.4 mg total) by mouth daily. 10 capsule 0   albuterol (VENTOLIN HFA) 108 (90 Base) MCG/ACT inhaler Inhale 2 puffs into the lungs every 6 (six) hours as needed for wheezing or shortness of breath. 8 g 0   buPROPion (WELLBUTRIN XL) 150 MG 24 hr tablet Take 150 mg by mouth daily.     escitalopram (LEXAPRO) 20 MG tablet Take 20 mg by mouth daily.     gabapentin (NEURONTIN) 300 MG capsule Take 1 capsule by mouth at noon. Take 3 capsules by mouth in the evening. 120 capsule 5   No  facility-administered medications prior to visit.    Review of Systems  Constitutional: Negative.   HENT: Negative.    Eyes: Negative.   Respiratory: Negative.    Cardiovascular: Negative.   Gastrointestinal: Negative.   Genitourinary: Negative.   Musculoskeletal: Negative.   Skin: Negative.   Neurological: Negative.   Psychiatric/Behavioral: Negative.    All other systems reviewed and are negative.    Objective:     BP 122/64   Pulse 84   Temp 98.2 F (36.8 C) (Temporal)   Resp 16   Ht '5\' 3"'$  (1.6 m)   Wt 170 lb 12.8 oz (77.5 kg)   SpO2 97%   BMI 30.26 kg/m   Wt Readings from Last 3 Encounters:  04/13/21 170 lb 12.8 oz (77.5 kg)  09/08/20 170 lb 6.4 oz (77.3 kg)  08/20/20 170 lb (77.1 kg)   Physical Exam Vitals and nursing note reviewed.  Constitutional:      General: She is not in acute distress.    Appearance: Normal appearance. She is normal weight. She is not ill-appearing, toxic-appearing or diaphoretic.  Cardiovascular:     Rate and Rhythm: Normal rate and regular rhythm.     Heart sounds: Normal heart sounds. No murmur heard.   No friction rub. No gallop.  Pulmonary:     Effort: Pulmonary effort is  normal. No respiratory distress.     Breath sounds: Normal breath sounds. No stridor. No wheezing, rhonchi or rales.  Chest:     Chest wall: No tenderness.  Skin:    General: Skin is warm and dry.  Neurological:     General: No focal deficit present.     Mental Status: She is alert and oriented to person, place, and time. Mental status is at baseline.  Psychiatric:        Mood and Affect: Mood normal.        Behavior: Behavior normal.        Thought Content: Thought content normal.        Judgment: Judgment normal.    No results found for any visits on 04/13/21.    The ASCVD Risk score (Arnett DK, et al., 2019) failed to calculate for the following reasons:   The 2019 ASCVD risk score is only valid for ages 61 to 35    Assessment & Plan:    Problem List Items Addressed This Visit       Respiratory   Atelectasis of both lungs    Refill albuterol. Monitor effect. Refills prn.        Relevant Medications   albuterol (VENTOLIN HFA) 108 (90 Base) MCG/ACT inhaler     Other   Anxiety disorder - Primary    Stable on current medications. Refills given. Recheck q6-12 mo.       Relevant Medications   escitalopram (LEXAPRO) 20 MG tablet   Fibromyalgia    Stable on gabapentin. Refills given        Relevant Medications   gabapentin (NEURONTIN) 300 MG capsule   escitalopram (LEXAPRO) 20 MG tablet    Meds ordered this encounter  Medications   gabapentin (NEURONTIN) 300 MG capsule    Sig: Take 1 capsule by mouth at noon. Take 3 capsules by mouth in the evening.    Dispense:  120 capsule    Refill:  5   DISCONTD: buPROPion (WELLBUTRIN XL) 150 MG 24 hr tablet    Sig: Take 1 tablet (150 mg total) by mouth daily.    Dispense:  90 tablet    Refill:  1   escitalopram (LEXAPRO) 20 MG tablet    Sig: Take 1 tablet (20 mg total) by mouth daily.    Dispense:  90 tablet    Refill:  1   albuterol (VENTOLIN HFA) 108 (90 Base) MCG/ACT inhaler    Sig: Inhale 2 puffs into the lungs every 6 (six) hours as needed for wheezing or shortness of breath.    Dispense:  8 g    Refill:  0    Return in about 1 year (around 04/14/2022) for CPE and labs.    Maximiano Coss, NP

## 2021-08-21 IMAGING — CT CT RENAL STONE PROTOCOL
2 of 4 series · 16 of 46 positions shown, 18 images · non-contrast
Comparison: MRI abdomen dated 04/15/2020. CT abdomen/pelvis dated
03/16/2020.

CLINICAL DATA: Left flank pain, history of kidney stones

EXAM:
CT ABDOMEN AND PELVIS WITHOUT CONTRAST
TECHNIQUE: Multidetector CT imaging of the abdomen and pelvis was performed
following the standard protocol without IV contrast.

[Series 2: axial st · axial · 0.79mm/px · z∈[-766,-316]mm · 13 of 102 slices shown, 15 images]
[im 6/102  soft-tissue]
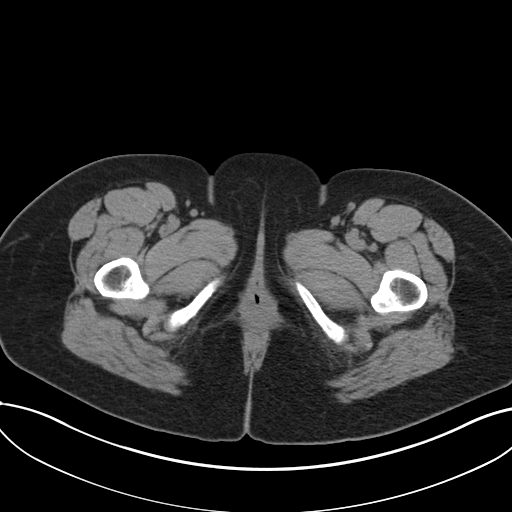
[im 6/102  bone]
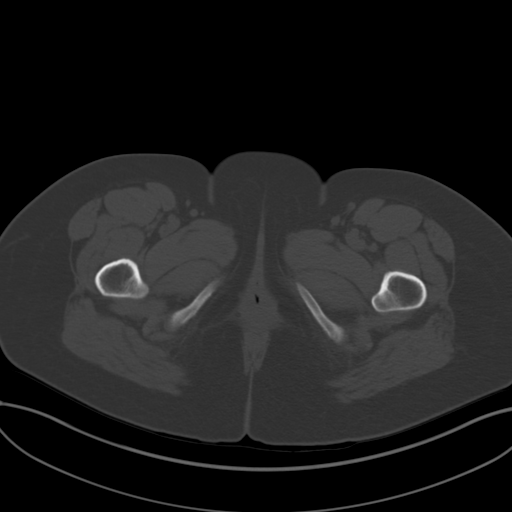
[im 12/102  soft-tissue]
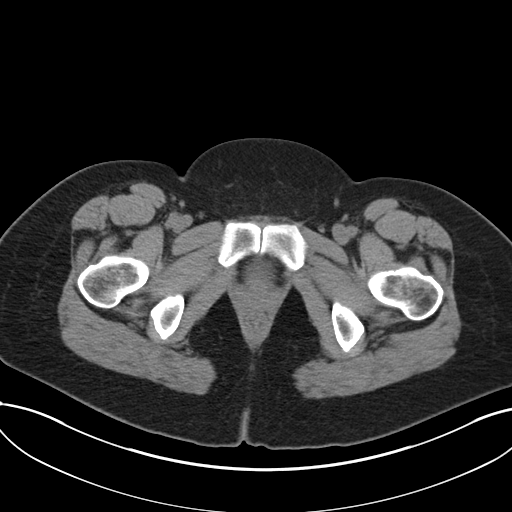
[im 23/102  soft-tissue]
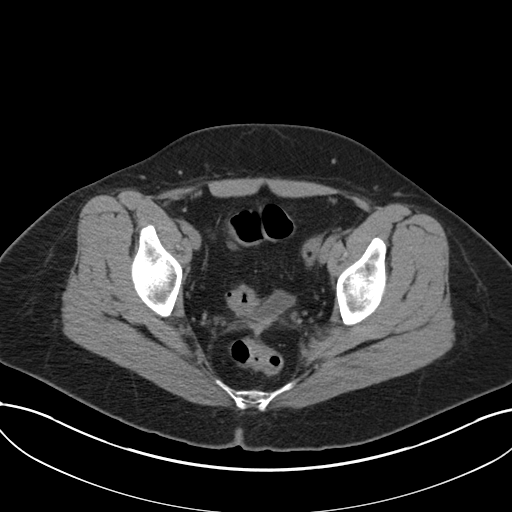
[im 29/102  soft-tissue]
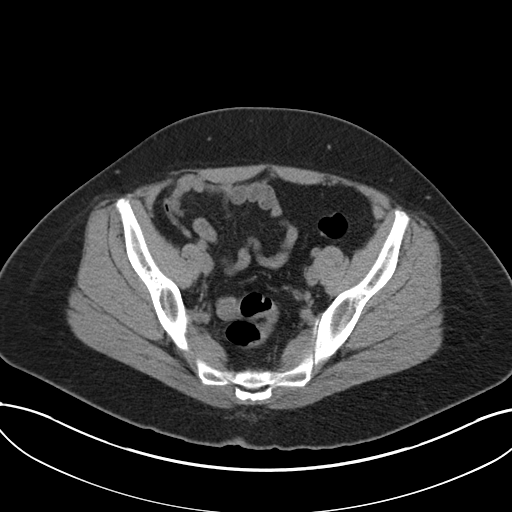
[im 34/102  soft-tissue]
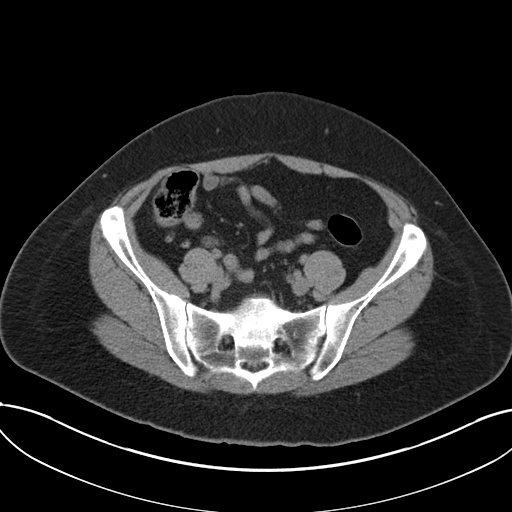
[im 45/102  soft-tissue]
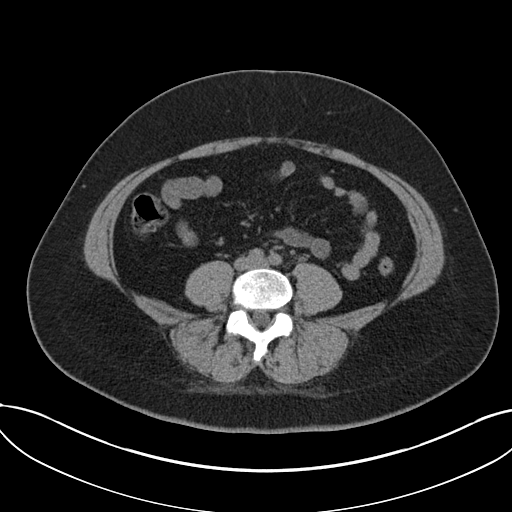
[im 51/102  soft-tissue]
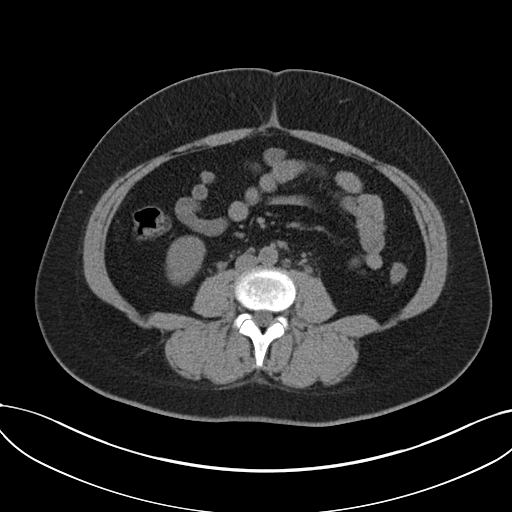
[im 57/102  soft-tissue]
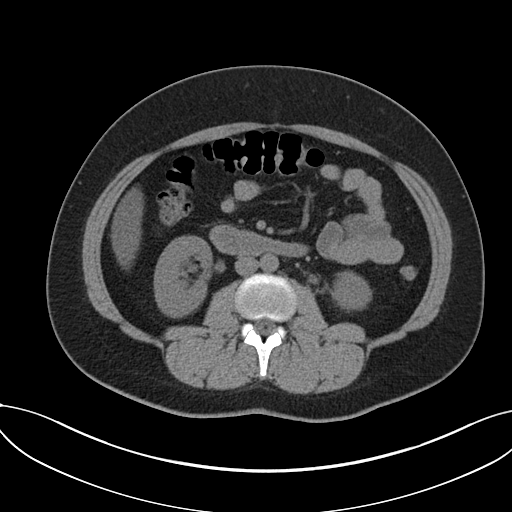
[im 68/102  soft-tissue]
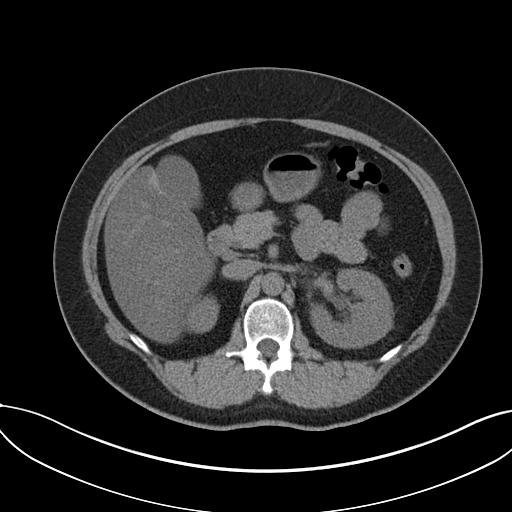
[im 68/102  bone]
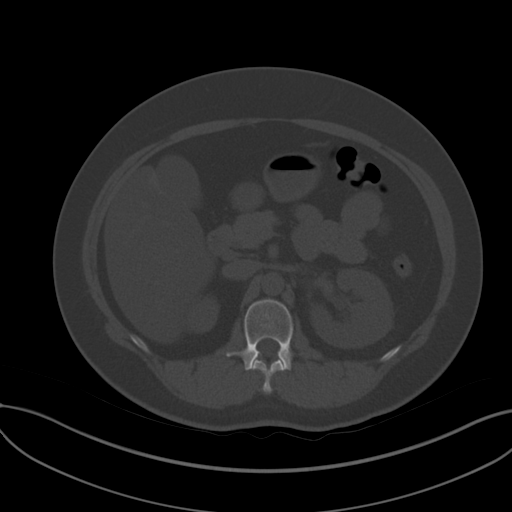
[im 73/102  soft-tissue]
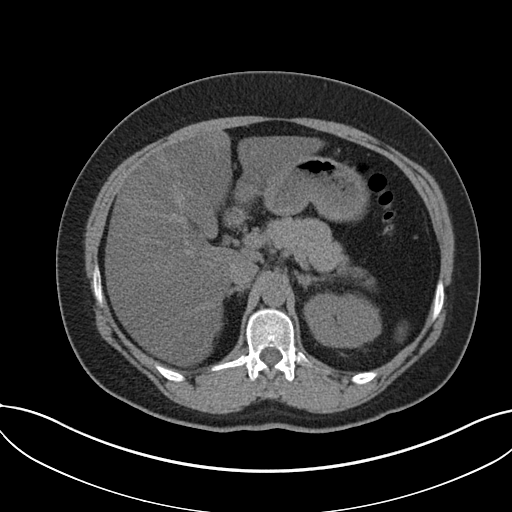
[im 79/102  soft-tissue]
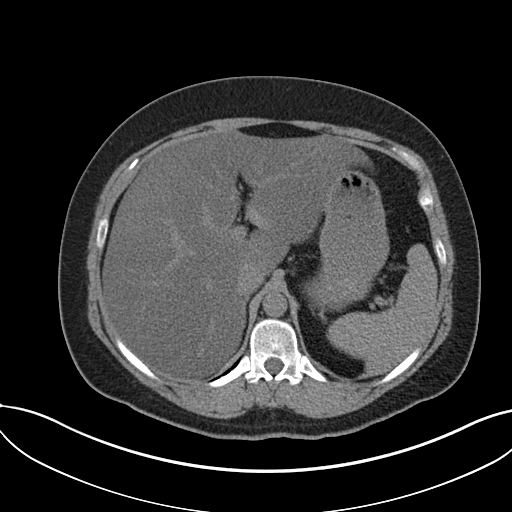
[im 90/102  soft-tissue]
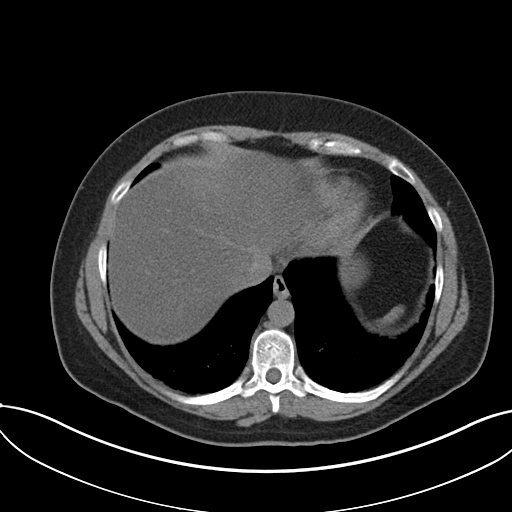
[im 96/102  soft-tissue]
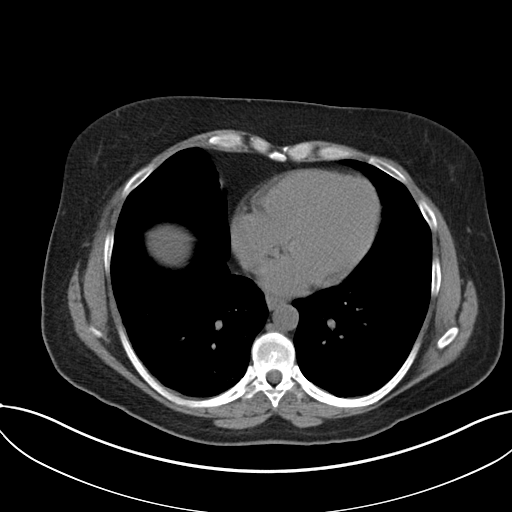

[Series 5: coronal · coronal · 0.76mm/px · 3 of 151 slices shown]
[im 51/151  soft-tissue]
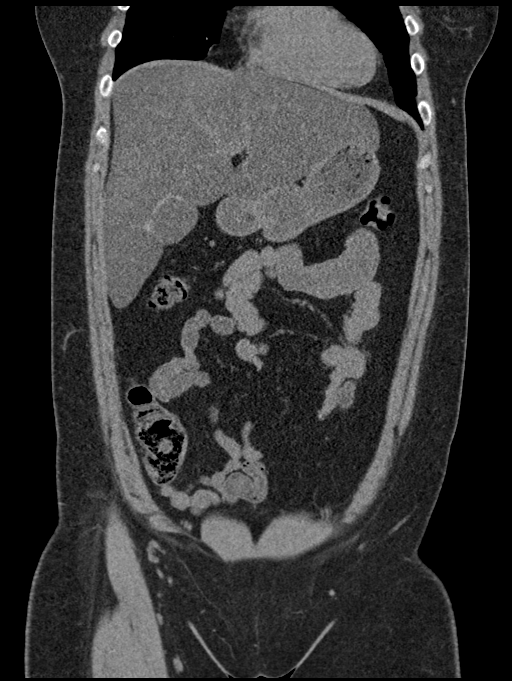
[im 67/151  soft-tissue]
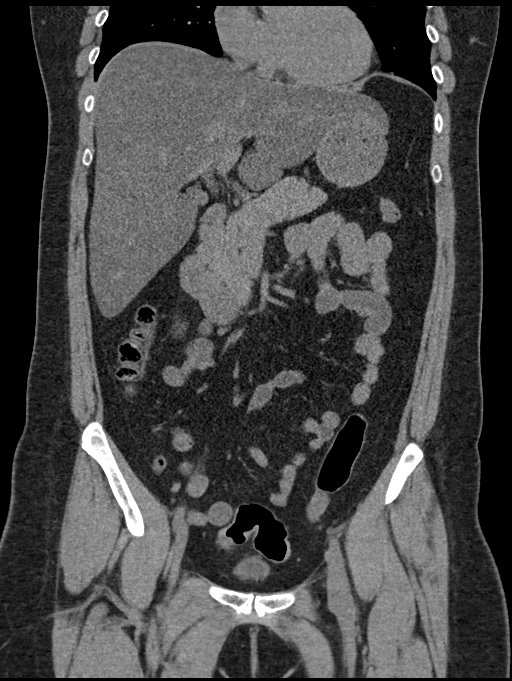
[im 84/151  soft-tissue]
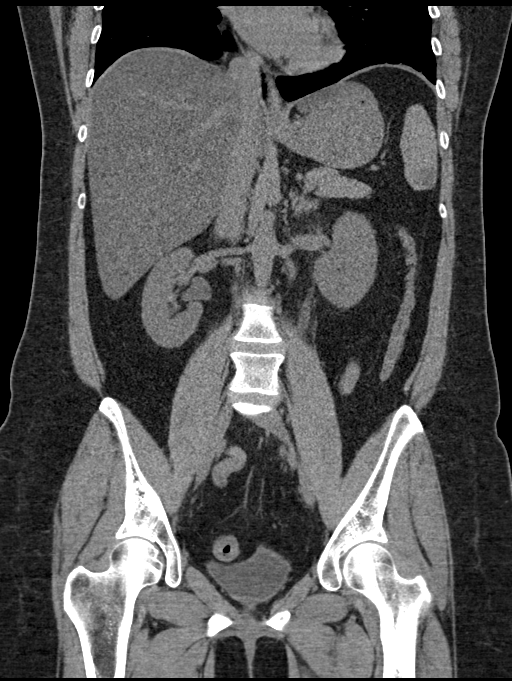

[16 of 46 positions shown; findings below may reference images not displayed]

FINDINGS: Lower chest: Mild dependent atelectasis in the bilateral lower
lobes.

Hepatobiliary: Severe hepatic steatosis with focal fatty sparing
along the gallbladder fossa.

Gallbladder is unremarkable. No intrahepatic or extrahepatic ductal
dilatation.

Pancreas: Within normal limits.

Spleen: Multiple low-density splenic lesions, compatible with
hemangiomas on MR.

Adrenals/Urinary Tract: Adrenal glands are within normal limits.

2 mm interpolar right renal calculus (coronal image 93). Mild right
hydroureteronephrosis. Associated 4 mm proximal right ureteral
calculus at the L3-4 level.

Three nonobstructing left renal calculi, measuring up to 3 mm in the
left upper pole (coronal image 99). Mild left hydroureteronephrosis.
Associated 4 mm distal left ureteral calculus above the UVJ (series
5/image 100).

Bladder is within normal limits.

Stomach/Bowel: Stomach is within normal limits.

No evidence of bowel obstruction.

Normal appendix (series 2/image 67).

Vascular/Lymphatic: No evidence of abdominal aortic aneurysm.

No suspicious abdominopelvic lymphadenopathy.

Reproductive: Status post hysterectomy.  No adnexal masses.

Other: No abdominopelvic ascites.

Musculoskeletal: Visualized osseous structures are within normal
limits.
IMPRESSION: 4 mm proximal right ureteral calculus with mild right
hydroureteronephrosis.

4 mm distal left ureteral calculus with mild left
hydroureteronephrosis.

Bilateral nonobstructing renal calculi, as above.

## 2021-11-12 ENCOUNTER — Ambulatory Visit (INDEPENDENT_AMBULATORY_CARE_PROVIDER_SITE_OTHER): Admitting: Family Medicine

## 2021-11-12 ENCOUNTER — Encounter: Payer: Self-pay | Admitting: Family Medicine

## 2021-11-12 VITALS — BP 130/82 | HR 93 | Temp 97.9°F | Resp 17 | Ht 63.0 in | Wt 176.5 lb

## 2021-11-12 DIAGNOSIS — F411 Generalized anxiety disorder: Secondary | ICD-10-CM | POA: Diagnosis not present

## 2021-11-12 DIAGNOSIS — M797 Fibromyalgia: Secondary | ICD-10-CM | POA: Diagnosis not present

## 2021-11-12 DIAGNOSIS — E669 Obesity, unspecified: Secondary | ICD-10-CM | POA: Insufficient documentation

## 2021-11-12 LAB — CBC WITH DIFFERENTIAL/PLATELET
Basophils Absolute: 0 10*3/uL (ref 0.0–0.1)
Basophils Relative: 0.2 % (ref 0.0–3.0)
Eosinophils Absolute: 0.2 10*3/uL (ref 0.0–0.7)
Eosinophils Relative: 1.2 % (ref 0.0–5.0)
HCT: 45.8 % (ref 36.0–46.0)
Hemoglobin: 15.2 g/dL — ABNORMAL HIGH (ref 12.0–15.0)
Lymphocytes Relative: 41.9 % (ref 12.0–46.0)
Lymphs Abs: 6.1 10*3/uL — ABNORMAL HIGH (ref 0.7–4.0)
MCHC: 33.3 g/dL (ref 30.0–36.0)
MCV: 92.6 fl (ref 78.0–100.0)
Monocytes Absolute: 0.9 10*3/uL (ref 0.1–1.0)
Monocytes Relative: 6 % (ref 3.0–12.0)
Neutro Abs: 7.4 10*3/uL (ref 1.4–7.7)
Neutrophils Relative %: 50.7 % (ref 43.0–77.0)
Platelets: 339 10*3/uL (ref 150.0–400.0)
RBC: 4.94 Mil/uL (ref 3.87–5.11)
RDW: 13.2 % (ref 11.5–15.5)
WBC: 14.6 10*3/uL — ABNORMAL HIGH (ref 4.0–10.5)

## 2021-11-12 LAB — HEPATIC FUNCTION PANEL
ALT: 54 U/L — ABNORMAL HIGH (ref 0–35)
AST: 29 U/L (ref 0–37)
Albumin: 4.8 g/dL (ref 3.5–5.2)
Alkaline Phosphatase: 119 U/L — ABNORMAL HIGH (ref 39–117)
Bilirubin, Direct: 0.1 mg/dL (ref 0.0–0.3)
Total Bilirubin: 0.3 mg/dL (ref 0.2–1.2)
Total Protein: 8 g/dL (ref 6.0–8.3)

## 2021-11-12 LAB — BASIC METABOLIC PANEL
BUN: 15 mg/dL (ref 6–23)
CO2: 25 mEq/L (ref 19–32)
Calcium: 9.6 mg/dL (ref 8.4–10.5)
Chloride: 105 mEq/L (ref 96–112)
Creatinine, Ser: 0.72 mg/dL (ref 0.40–1.20)
GFR: 105.24 mL/min (ref >60.00)
Glucose, Bld: 88 mg/dL (ref 70–99)
Potassium: 4 mEq/L (ref 3.5–5.1)
Sodium: 140 mEq/L (ref 135–145)

## 2021-11-12 LAB — LIPID PANEL
Cholesterol: 233 mg/dL — ABNORMAL HIGH (ref 0–200)
HDL: 41.1 mg/dL (ref >39.00)
Total CHOL/HDL Ratio: 6
Triglycerides: 713 mg/dL — ABNORMAL HIGH (ref 0.0–149.0)

## 2021-11-12 LAB — TSH: TSH: 1.26 u[IU]/mL (ref 0.35–5.50)

## 2021-11-12 LAB — LDL CHOLESTEROL, DIRECT: Direct LDL: 109 mg/dL

## 2021-11-12 LAB — VITAMIN D 25 HYDROXY (VIT D DEFICIENCY, FRACTURES): VITD: 29.44 ng/mL — ABNORMAL LOW (ref 30.00–100.00)

## 2021-11-12 MED ORDER — ESCITALOPRAM OXALATE 20 MG PO TABS: 20.0000 mg | ORAL_TABLET | Freq: Every day | ORAL | 1 refills | Status: DC

## 2021-11-12 MED ORDER — BUPROPION HCL ER (XL) 300 MG PO TB24: 300.0000 mg | ORAL_TABLET | Freq: Every day | ORAL | 3 refills | Status: DC

## 2021-11-12 MED ORDER — DIAZEPAM 5 MG PO TABS: 5.0000 mg | ORAL_TABLET | Freq: Two times a day (BID) | ORAL | 1 refills | Status: DC | PRN

## 2021-11-12 MED ORDER — GABAPENTIN 300 MG PO CAPS: 300.0000 mg | ORAL_CAPSULE | Freq: Three times a day (TID) | ORAL | 3 refills | Status: DC

## 2021-11-12 NOTE — Assessment & Plan Note (Signed)
New to provider, ongoing issue for pt.  She has struggled w/ her weight since her hysterectomy 8 yrs ago.  She reports exercising daily and being mindful of her diet.  She says she has tried multiple different programs w/o success.  Will increase Wellbutrin to help w/ mood in the hopes this also helps metabolism.  Check labs to risk stratify.  Will follow.

## 2021-11-12 NOTE — Assessment & Plan Note (Signed)
Ongoing issue.  On SSRI and gabapentin.  No med changes at this time, refills provided.

## 2021-11-12 NOTE — Assessment & Plan Note (Signed)
Deteriorated.  Pt has not had Diazepam refill in years and when she needed it yesterday it was ineffective.  Very tearful in office today.  Feels overwhelmed- particularly about her weight.  Will increase Wellbutrin to '300mg'$  daily and monitor for improvement.  Pt expressed understanding and is in agreement w/ plan.

## 2021-11-12 NOTE — Patient Instructions (Signed)
Follow up in 6 weeks to recheck mood We'll notify you of your lab results and make any changes if needed INCREASE the Wellbutrin to '300mg'$  daily CONTINUE the Lexapro and Gabapentin as they are USE the Diazepam as needed Keep up the good work on healthy diet and regular exercise- you can do it! Call with any questions or concerns Hang in there!!!

## 2021-11-12 NOTE — Progress Notes (Signed)
   Subjective:    Patient ID: Lindsay Ross, female    DOB: [birth year], 41 y.o.   MRN: [id number]  HPI Anxiety- ongoing issue.  Currently on Wellbutrin XL '150mg'$  daily, Lexapro '20mg'$  daily, Diazepam 5-'10mg'$  PRN.  Pt has not had Diazepam refill in ~3 yrs.  Felt very anxious yesterday- 'on the verge of a panic attack' and when she took the Diazepam it was ineffective.  Hoping for a refill.    Fibro- ongoing issue.  On Lexapro '20mg'$  daily and Gabapentin '300mg'$  at noon and '900mg'$  QHS.    Obesity- pt reports she has gained considerable weight since hysterectomy 8 yrs ago.  She says she will try 'diet after diet' but never lose more than 10 lbs.  Walking 5 miles/day, exercising regularly.   Review of Systems For ROS see HPI     Objective:   Physical Exam Vitals reviewed.  Constitutional:      General: She is not in acute distress.    Appearance: Normal appearance. She is well-developed. She is obese. She is not ill-appearing.  HENT:     Head: Normocephalic and atraumatic.  Eyes:     Conjunctiva/sclera: Conjunctivae normal.     Pupils: Pupils are equal, round, and reactive to light.  Neck:     Thyroid: No thyromegaly.  Cardiovascular:     Rate and Rhythm: Normal rate and regular rhythm.     Heart sounds: Normal heart sounds. No murmur heard. Pulmonary:     Effort: Pulmonary effort is normal. No respiratory distress.     Breath sounds: Normal breath sounds.  Abdominal:     General: There is no distension.     Palpations: Abdomen is soft.     Tenderness: There is no abdominal tenderness.  Musculoskeletal:     Cervical back: Normal range of motion and neck supple.     Right lower leg: No edema.     Left lower leg: No edema.  Lymphadenopathy:     Cervical: No cervical adenopathy.  Skin:    General: Skin is warm and dry.  Neurological:     Mental Status: She is alert and oriented to person, place, and time.  Psychiatric:        Behavior: Behavior normal.           Assessment &  Plan:

## 2021-11-15 ENCOUNTER — Other Ambulatory Visit: Payer: Self-pay | Admitting: Lab

## 2021-11-15 MED ORDER — FENOFIBRATE 160 MG PO TABS: 160.0000 mg | ORAL_TABLET | Freq: Every day | ORAL | 3 refills | Status: DC

## 2021-11-29 ENCOUNTER — Other Ambulatory Visit (INDEPENDENT_AMBULATORY_CARE_PROVIDER_SITE_OTHER)

## 2021-11-29 DIAGNOSIS — R7989 Other specified abnormal findings of blood chemistry: Secondary | ICD-10-CM

## 2021-11-29 LAB — HEPATIC FUNCTION PANEL
ALT: 37 U/L — ABNORMAL HIGH (ref 0–35)
AST: 26 U/L (ref 0–37)
Albumin: 4.6 g/dL (ref 3.5–5.2)
Alkaline Phosphatase: 90 U/L (ref 39–117)
Bilirubin, Direct: 0.1 mg/dL (ref 0.0–0.3)
Total Bilirubin: 0.3 mg/dL (ref 0.2–1.2)
Total Protein: 7.4 g/dL (ref 6.0–8.3)

## 2021-11-30 ENCOUNTER — Telehealth: Payer: Self-pay

## 2021-11-30 NOTE — Telephone Encounter (Signed)
-----   Message from Midge Minium, MD sent at 11/30/2021  7:26 AM EST ----- Liver functions look great!  No changes at this time

## 2021-11-30 NOTE — Telephone Encounter (Signed)
Left lab results on pt VM 

## 2021-12-24 ENCOUNTER — Ambulatory Visit (INDEPENDENT_AMBULATORY_CARE_PROVIDER_SITE_OTHER): Admitting: Family Medicine

## 2021-12-24 ENCOUNTER — Encounter: Payer: Self-pay | Admitting: Family Medicine

## 2021-12-24 VITALS — BP 128/76 | HR 86 | Temp 97.8°F | Resp 17 | Ht 63.0 in | Wt 157.5 lb

## 2021-12-24 DIAGNOSIS — E669 Obesity, unspecified: Secondary | ICD-10-CM

## 2021-12-24 DIAGNOSIS — F411 Generalized anxiety disorder: Secondary | ICD-10-CM | POA: Diagnosis not present

## 2021-12-24 NOTE — Assessment & Plan Note (Signed)
Improved.  Pt is doing very well at higher dose of Wellbutrin.  Will continue '300mg'$  daily and Lexapro at '20mg'$  daily.  Will continue to follow.

## 2021-12-24 NOTE — Patient Instructions (Addendum)
Schedule your complete physical in 6 months No changes! You are doing AMAZING!!!  Keep up the good work! Call with any questions or concerns Stay Safe!  Stay Healthy! Happy Holidays!!!

## 2021-12-24 NOTE — Progress Notes (Signed)
   Subjective:    Patient ID: Lindsay Ross, female    DOB: [birth year], 41 y.o.   MRN: [id number]  HPI Anxiety- at last visit we increased Wellbutrin to '300mg'$  daily while continuing Lexapro '20mg'$  daily.  Pt reports feeling much better.  Increased energy, feeling better physically.    Obesity- pt is down 20 lbs since last visit.  Pt has eliminated processed foods, fats.  Is exercising twice daily- walking and some aerobics.  Wellbutrin has decreased appetite.  Has not eaten out in 6 weeks.   Review of Systems For ROS see HPI     Objective:   Physical Exam Vitals reviewed.  Constitutional:      General: She is not in acute distress.    Appearance: Normal appearance. She is not ill-appearing.  HENT:     Head: Normocephalic and atraumatic.  Skin:    General: Skin is warm and dry.  Neurological:     General: No focal deficit present.     Mental Status: She is alert and oriented to person, place, and time.  Psychiatric:        Mood and Affect: Mood normal.        Behavior: Behavior normal.        Thought Content: Thought content normal.           Assessment & Plan:

## 2021-12-24 NOTE — Assessment & Plan Note (Signed)
RESOLVED.  Pt has lost 20 lbs since last visit.  Applauded her efforts.  She has made sweeping dietary changes and is now exercising twice daily.  So happy for her!

## 2022-02-28 ENCOUNTER — Telehealth: Payer: Self-pay | Admitting: Family Medicine

## 2022-02-28 NOTE — Telephone Encounter (Signed)
Ok to take ibuprofen as needed when on Fenofibrate

## 2022-02-28 NOTE — Telephone Encounter (Signed)
Called pt to inform she was could take Ibuprofen.

## 2022-02-28 NOTE — Telephone Encounter (Signed)
Caller name: [name]  On DPR?: Yes  Call back number: [phone] (home)  Provider they see: Midge Minium, MD  Reason for call:Pt is asking can she take Ibuprofen with fenofibrate 160 MG tablet -advise

## 2022-03-03 ENCOUNTER — Encounter: Payer: Self-pay | Admitting: Family Medicine

## 2022-03-03 NOTE — Telephone Encounter (Signed)
Called and informed them they are still requesting we fill out the forms for documentation purposes but they will proceed to scheduling

## 2022-03-03 NOTE — Telephone Encounter (Signed)
Pt has to have an appointment still for this considering she will be put under?

## 2022-03-17 ENCOUNTER — Other Ambulatory Visit: Payer: Self-pay

## 2022-03-17 ENCOUNTER — Encounter: Payer: Self-pay | Admitting: Family Medicine

## 2022-03-17 DIAGNOSIS — F411 Generalized anxiety disorder: Secondary | ICD-10-CM

## 2022-03-17 MED ORDER — FENOFIBRATE 160 MG PO TABS: 160.0000 mg | ORAL_TABLET | Freq: Every day | ORAL | 3 refills | Status: DC

## 2022-03-17 MED ORDER — ESCITALOPRAM OXALATE 20 MG PO TABS: 20.0000 mg | ORAL_TABLET | Freq: Every day | ORAL | 1 refills | Status: DC

## 2022-03-17 MED ORDER — BUPROPION HCL ER (XL) 300 MG PO TB24: 300.0000 mg | ORAL_TABLET | Freq: Every day | ORAL | 3 refills | Status: DC

## 2022-03-17 MED ORDER — GABAPENTIN 300 MG PO CAPS: 300.0000 mg | ORAL_CAPSULE | Freq: Three times a day (TID) | ORAL | 3 refills | Status: DC

## 2022-03-17 NOTE — Telephone Encounter (Signed)
Gabapentin  LOV: 12/215/23 Last Refill:11/12/21 Upcoming appt: 06/27/22

## 2022-03-17 NOTE — Telephone Encounter (Signed)
Informed pt that Rx has been sent in  

## 2022-06-27 ENCOUNTER — Encounter: Admitting: Family Medicine

## 2022-07-11 ENCOUNTER — Other Ambulatory Visit: Payer: Self-pay | Admitting: Family Medicine

## 2022-07-11 NOTE — Telephone Encounter (Signed)
Patient is requesting a refill of the following medications: Requested Prescriptions   Pending Prescriptions Disp Refills   diazepam (VALIUM) 5 MG tablet [Pharmacy Med Name: DIAZEPAM 5 MG TABLET] 30 tablet 0    Sig: TAKE 1 TABLET BY MOUTH EVERY 12 HOURS AS NEEDED FOR ANXIETY.    Date of patient request: 07/11/22 Last office visit: 11/12/21 Date of last refill: 12/24/21 Last refill amount: 30

## 2022-08-17 ENCOUNTER — Other Ambulatory Visit: Payer: Self-pay | Admitting: Family Medicine

## 2022-08-17 DIAGNOSIS — J9811 Atelectasis: Secondary | ICD-10-CM

## 2022-08-17 DIAGNOSIS — F411 Generalized anxiety disorder: Secondary | ICD-10-CM

## 2022-08-17 MED ORDER — GABAPENTIN 300 MG PO CAPS: 300.0000 mg | ORAL_CAPSULE | Freq: Three times a day (TID) | ORAL | 3 refills | Status: DC

## 2022-08-17 MED ORDER — ESCITALOPRAM OXALATE 20 MG PO TABS
20.0000 mg | ORAL_TABLET | Freq: Every day | ORAL | 1 refills | Status: DC
Start: 2022-08-17 — End: 2023-01-20

## 2022-08-17 MED ORDER — FENOFIBRATE 160 MG PO TABS: 160.0000 mg | ORAL_TABLET | Freq: Every day | ORAL | 1 refills | Status: DC

## 2022-08-17 MED ORDER — ALBUTEROL SULFATE HFA 108 (90 BASE) MCG/ACT IN AERS
2.0000 | INHALATION_SPRAY | Freq: Four times a day (QID) | RESPIRATORY_TRACT | 0 refills | Status: DC | PRN
Start: 2022-08-17 — End: 2023-08-28

## 2022-08-17 MED ORDER — DIAZEPAM 5 MG PO TABS: 5.0000 mg | ORAL_TABLET | Freq: Two times a day (BID) | ORAL | 0 refills | Status: AC | PRN

## 2022-08-17 MED ORDER — BUPROPION HCL ER (XL) 300 MG PO TB24
300.0000 mg | ORAL_TABLET | Freq: Every day | ORAL | 1 refills | Status: DC
Start: 2022-08-17 — End: 2023-01-20

## 2022-08-17 NOTE — Telephone Encounter (Signed)
Encourage patient to contact the pharmacy for refills or they can request refills through [facility]  WHAT PHARMACY WOULD THEY LIKE THIS SENT TO:  [facility] [address] [place], [place] [address]   MEDICATION NAME & DOSE: gabapentin (NEURONTIN) 300 MG capsule fenofibrate 160 MG tablet escitalopram (LEXAPRO) 20 MG tablet buPROPion (WELLBUTRIN XL) 300 MG 24 hr tablet albuterol (VENTOLIN HFA) 108 (90 Base) MCG/ACT inhaler diazepam (VALIUM) 5 MG tablet  NOTES/COMMENTS FROM PATIENT: [facility] is out of network for this pt.      Front office please notify patient: It takes 48-72 hours to process rx refill requests Ask patient to call pharmacy to ensure rx is ready before heading there.

## 2022-08-17 NOTE — Telephone Encounter (Signed)
Valium 5 mg LOV: 12/24/21 Last Refill:07/11/22 Upcoming appt: none

## 2022-12-21 ENCOUNTER — Telehealth: Payer: Self-pay

## 2022-12-21 MED ORDER — GABAPENTIN 300 MG PO CAPS: 300.0000 mg | ORAL_CAPSULE | Freq: Three times a day (TID) | ORAL | 1 refills | Status: DC

## 2022-12-21 NOTE — Addendum Note (Signed)
Addended by: Sheliah Hatch on: 12/21/2022 01:45 PM   Modules accepted: Orders

## 2022-12-21 NOTE — Telephone Encounter (Signed)
Prescription sent to pharmacy.

## 2022-12-21 NOTE — Telephone Encounter (Signed)
Called pt and left vm that refill has been sent

## 2022-12-21 NOTE — Telephone Encounter (Signed)
Pt will be out of Gabapentin in a few days. And would a script until her appt 01/20/2023 -advise

## 2022-12-21 NOTE — Telephone Encounter (Signed)
Sent to PCP ?

## 2023-01-20 ENCOUNTER — Ambulatory Visit (INDEPENDENT_AMBULATORY_CARE_PROVIDER_SITE_OTHER): Admitting: Family Medicine

## 2023-01-20 ENCOUNTER — Encounter: Payer: Self-pay | Admitting: Family Medicine

## 2023-01-20 VITALS — BP 108/68 | HR 89 | Temp 98.7°F | Ht 63.0 in | Wt 124.0 lb

## 2023-01-20 DIAGNOSIS — Z114 Encounter for screening for human immunodeficiency virus [HIV]: Secondary | ICD-10-CM | POA: Diagnosis not present

## 2023-01-20 DIAGNOSIS — Z0000 Encounter for general adult medical examination without abnormal findings: Secondary | ICD-10-CM | POA: Diagnosis not present

## 2023-01-20 DIAGNOSIS — Z1159 Encounter for screening for other viral diseases: Secondary | ICD-10-CM | POA: Diagnosis not present

## 2023-01-20 DIAGNOSIS — Z1231 Encounter for screening mammogram for malignant neoplasm of breast: Secondary | ICD-10-CM | POA: Diagnosis not present

## 2023-01-20 LAB — CBC WITH DIFFERENTIAL/PLATELET
Basophils Absolute: 0 10*3/uL (ref 0.0–0.1)
Basophils Relative: 0.4 % (ref 0.0–3.0)
Eosinophils Absolute: 0.1 10*3/uL (ref 0.0–0.7)
Eosinophils Relative: 1.2 % (ref 0.0–5.0)
HCT: 45.6 % (ref 36.0–46.0)
Hemoglobin: 15 g/dL (ref 12.0–15.0)
Lymphocytes Relative: 51.7 % — ABNORMAL HIGH (ref 12.0–46.0)
Lymphs Abs: 5.7 10*3/uL — ABNORMAL HIGH (ref 0.7–4.0)
MCHC: 32.9 g/dL (ref 30.0–36.0)
MCV: 94.5 fL (ref 78.0–100.0)
Monocytes Absolute: 0.7 10*3/uL (ref 0.1–1.0)
Monocytes Relative: 6.2 % (ref 3.0–12.0)
Neutro Abs: 4.5 10*3/uL (ref 1.4–7.7)
Neutrophils Relative %: 40.5 % — ABNORMAL LOW (ref 43.0–77.0)
Platelets: 271 10*3/uL (ref 150.0–400.0)
RBC: 4.82 Mil/uL (ref 3.87–5.11)
RDW: 13.2 % (ref 11.5–15.5)
WBC: 11 10*3/uL — ABNORMAL HIGH (ref 4.0–10.5)

## 2023-01-20 LAB — HEPATIC FUNCTION PANEL
ALT: 12 U/L (ref 0–35)
AST: 11 U/L (ref 0–37)
Albumin: 4.5 g/dL (ref 3.5–5.2)
Alkaline Phosphatase: 83 U/L (ref 39–117)
Bilirubin, Direct: 0.1 mg/dL (ref 0.0–0.3)
Total Bilirubin: 0.4 mg/dL (ref 0.2–1.2)
Total Protein: 7.2 g/dL (ref 6.0–8.3)

## 2023-01-20 LAB — BASIC METABOLIC PANEL
BUN: 11 mg/dL (ref 6–23)
CO2: 30 meq/L (ref 19–32)
Calcium: 9.7 mg/dL (ref 8.4–10.5)
Chloride: 106 meq/L (ref 96–112)
Creatinine, Ser: 0.67 mg/dL (ref 0.40–1.20)
GFR: 109.1 mL/min (ref >60.00)
Glucose, Bld: 76 mg/dL (ref 70–99)
Potassium: 4.7 meq/L (ref 3.5–5.1)
Sodium: 143 meq/L (ref 135–145)

## 2023-01-20 LAB — LIPID PANEL
Cholesterol: 183 mg/dL (ref 0–200)
HDL: 46.5 mg/dL (ref >39.00)
LDL Cholesterol: 113 mg/dL — ABNORMAL HIGH (ref 0–99)
NonHDL: 136.92
Total CHOL/HDL Ratio: 4
Triglycerides: 121 mg/dL (ref 0.0–149.0)
VLDL: 24.2 mg/dL (ref 0.0–40.0)

## 2023-01-20 LAB — TSH: TSH: 0.84 u[IU]/mL (ref 0.35–5.50)

## 2023-01-20 MED ORDER — ESTRADIOL 10 MCG VA TABS: 1.0000 | ORAL_TABLET | VAGINAL | 6 refills | Status: AC

## 2023-01-20 MED ORDER — GABAPENTIN 300 MG PO CAPS: 300.0000 mg | ORAL_CAPSULE | Freq: Three times a day (TID) | ORAL | 1 refills | Status: DC

## 2023-01-20 NOTE — Patient Instructions (Addendum)
 Follow up in 1 year or as needed We'll notify you of your lab results and make any changes if needed Keep up the good work on healthy diet and regular exercise- you look great! USE the vaginal estrogen as directed RESTART the Gabapentin  We'll call you to schedule your mammogram Call with any questions or concerns Stay Safe!  Stay Healthy! CONGRATS ON THE NEW YOU!!!

## 2023-01-20 NOTE — Progress Notes (Signed)
   Subjective:    Patient ID: Lindsay Ross, female    DOB: Feb 10, 1980, 43 y.o.   MRN: [id number]  HPI CPE- pt reports feeling 'amazing'.  UTD on Tdap.  Due to start mammograms.  Patient Care Team    Relationship Specialty Notifications Start End  Mahlon Comer BRAVO, MD PCP - General Family Medicine  12/24/21   Nurse Practitioner Services    01/20/23     Health Maintenance  Topic Date Due   Pneumococcal Vaccine 63-30 Years old (1 of 2 - PCV) Never done   HIV Screening  Never done   Hepatitis C Screening  Never done   HPV VACCINES (2 - 3-dose series) 10/17/2007   INFLUENZA VACCINE  08/11/2022   COVID-19 Vaccine (4 - 2024-25 season) 09/11/2022   DTaP/Tdap/Td (2 - Td or Tdap) 01/11/2027      Review of Systems Patient reports no vision/ hearing changes, adenopathy,fever,  persistant/recurrent hoarseness , swallowing issues, chest pain, palpitations, edema, persistant/recurrent cough, hemoptysis, dyspnea (rest/exertional/paroxysmal nocturnal), gastrointestinal bleeding (melena, rectal bleeding), abdominal pain, significant heartburn, bowel changes, GU symptoms (dysuria, hematuria, incontinence),  syncope, focal weakness, memory loss, numbness & tingling, skin/hair/nail changes, abnormal bruising or bleeding, anxiety, or depression.   + 50 lb weight loss in the last 14 months.  Lost 35 lbs on her own, started Mounjaro in August for the last 15. + vaginal dryness- would like to restart vaginal estrogen    Objective:   Physical Exam General Appearance:    Alert, cooperative, no distress, appears stated age  Head:    Normocephalic, without obvious abnormality, atraumatic  Eyes:    PERRL, conjunctiva/corneas clear, EOM's intact both eyes  Ears:    Normal TM's and external ear canals, both ears  Nose:   Nares normal, septum midline, mucosa normal, no drainage    or sinus tenderness  Throat:   Lips, mucosa, and tongue normal; teeth and gums normal  Neck:   Supple, symmetrical, trachea  midline, no adenopathy;    Thyroid : no enlargement/tenderness/nodules  Back:     Symmetric, no curvature, ROM normal, no CVA tenderness  Lungs:     Clear to auscultation bilaterally, respirations unlabored  Chest Wall:    No tenderness or deformity   Heart:    Regular rate and rhythm, S1 and S2 normal, no murmur, rub   or gallop  Breast Exam:    Deferred to GYN  Abdomen:     Soft, non-tender, bowel sounds active all four quadrants,    no masses, no organomegaly  Genitalia:    Deferred to GYN  Rectal:    Extremities:   Extremities normal, atraumatic, no cyanosis or edema  Pulses:   2+ and symmetric all extremities  Skin:   Skin color, texture, turgor normal, no rashes or lesions  Lymph nodes:   Cervical, supraclavicular, and axillary nodes normal  Neurologic:   CNII-XII intact, normal strength, sensation and reflexes    throughout          Assessment & Plan:

## 2023-01-21 DIAGNOSIS — Z0000 Encounter for general adult medical examination without abnormal findings: Secondary | ICD-10-CM | POA: Insufficient documentation

## 2023-01-21 LAB — HIV ANTIBODY (ROUTINE TESTING W REFLEX): HIV 1&2 Ab, 4th Generation: NONREACTIVE

## 2023-01-21 LAB — HEPATITIS C ANTIBODY: Hepatitis C Ab: NONREACTIVE

## 2023-01-21 NOTE — Assessment & Plan Note (Signed)
 Pt's PE WNL.  She has lost over 50 lbs in the last 14 months.  Applauded her efforts.  Due to start mammogram- order entered.  UTD on Tdap.  Declines flu.  Check labs.  Anticipatory guidance provided.

## 2023-01-23 ENCOUNTER — Telehealth: Payer: Self-pay

## 2023-01-23 NOTE — Telephone Encounter (Signed)
 Pt has reviewed via MyChart

## 2023-01-23 NOTE — Telephone Encounter (Signed)
-----   Message from Neena Rhymes sent at 01/23/2023  7:39 AM EST ----- Labs look great!  No changes at this time

## 2023-01-24 ENCOUNTER — Encounter: Payer: Self-pay | Admitting: Family Medicine

## 2023-02-07 ENCOUNTER — Ambulatory Visit (HOSPITAL_BASED_OUTPATIENT_CLINIC_OR_DEPARTMENT_OTHER)
Admission: RE | Admit: 2023-02-07 | Discharge: 2023-02-07 | Disposition: A | Discharge status: Home / Self Care | Source: Ambulatory Visit | Attending: Family Medicine | Admitting: Family Medicine

## 2023-02-07 ENCOUNTER — Encounter (HOSPITAL_BASED_OUTPATIENT_CLINIC_OR_DEPARTMENT_OTHER): Payer: Self-pay | Admitting: Radiology

## 2023-02-07 DIAGNOSIS — Z1231 Encounter for screening mammogram for malignant neoplasm of breast: Secondary | ICD-10-CM | POA: Insufficient documentation

## 2023-02-09 ENCOUNTER — Other Ambulatory Visit: Payer: Self-pay | Admitting: Family Medicine

## 2023-02-09 ENCOUNTER — Telehealth: Payer: Self-pay

## 2023-02-09 DIAGNOSIS — R928 Other abnormal and inconclusive findings on diagnostic imaging of breast: Secondary | ICD-10-CM

## 2023-02-09 NOTE — Telephone Encounter (Signed)
Sent to Dr.Tabori

## 2023-02-09 NOTE — Telephone Encounter (Signed)
Signed and returned to [place] to fax

## 2023-02-09 NOTE — Telephone Encounter (Signed)
Faxed and placed in scan bin

## 2023-02-16 ENCOUNTER — Ambulatory Visit
Admission: RE | Admit: 2023-02-16 | Discharge: 2023-02-16 | Disposition: A | Discharge status: Home / Self Care | Source: Ambulatory Visit | Attending: Family Medicine | Admitting: Family Medicine

## 2023-02-16 DIAGNOSIS — R928 Other abnormal and inconclusive findings on diagnostic imaging of breast: Secondary | ICD-10-CM

## 2023-02-20 ENCOUNTER — Other Ambulatory Visit: Payer: Self-pay | Admitting: Family Medicine

## 2023-02-20 DIAGNOSIS — N6320 Unspecified lump in the left breast, unspecified quadrant: Secondary | ICD-10-CM

## 2023-03-09 ENCOUNTER — Other Ambulatory Visit

## 2023-03-09 ENCOUNTER — Encounter

## 2023-03-22 ENCOUNTER — Other Ambulatory Visit: Payer: Self-pay

## 2023-03-22 ENCOUNTER — Other Ambulatory Visit: Payer: Self-pay | Admitting: Family Medicine

## 2023-05-22 ENCOUNTER — Other Ambulatory Visit: Payer: Self-pay | Admitting: Family Medicine

## 2023-07-21 ENCOUNTER — Other Ambulatory Visit: Payer: Self-pay | Admitting: Family Medicine

## 2023-07-26 ENCOUNTER — Encounter: Payer: Self-pay | Admitting: Family Medicine

## 2023-07-27 MED ORDER — GABAPENTIN 300 MG PO CAPS: 300.0000 mg | ORAL_CAPSULE | Freq: Three times a day (TID) | ORAL | 1 refills | Status: DC

## 2023-08-17 ENCOUNTER — Ambulatory Visit
Admission: RE | Admit: 2023-08-17 | Discharge: 2023-08-17 | Disposition: A | Discharge status: Home / Self Care | Source: Ambulatory Visit | Attending: Family Medicine | Admitting: Family Medicine

## 2023-08-17 DIAGNOSIS — N6310 Unspecified lump in the right breast, unspecified quadrant: Secondary | ICD-10-CM

## 2023-08-28 ENCOUNTER — Other Ambulatory Visit: Payer: Self-pay

## 2023-08-28 DIAGNOSIS — J9811 Atelectasis: Secondary | ICD-10-CM

## 2023-08-28 MED ORDER — ALBUTEROL SULFATE HFA 108 (90 BASE) MCG/ACT IN AERS: 2.0000 | INHALATION_SPRAY | Freq: Four times a day (QID) | RESPIRATORY_TRACT | 0 refills | Status: DC | PRN

## 2023-09-27 ENCOUNTER — Other Ambulatory Visit: Payer: Self-pay | Admitting: Family Medicine

## 2023-10-03 ENCOUNTER — Other Ambulatory Visit: Payer: Self-pay | Admitting: Family Medicine

## 2023-10-03 DIAGNOSIS — J9811 Atelectasis: Secondary | ICD-10-CM

## 2023-10-04 ENCOUNTER — Other Ambulatory Visit: Payer: Self-pay | Admitting: Family Medicine

## 2023-10-04 MED ORDER — GABAPENTIN 300 MG PO CAPS: 300.0000 mg | ORAL_CAPSULE | Freq: Three times a day (TID) | ORAL | 1 refills | Status: DC

## 2023-10-04 NOTE — Telephone Encounter (Signed)
 Copied from CRM #[id number]. Topic: Clinical - Prescription Issue >> Oct 04, 2023  1:18 PM Alfonso ORN wrote: Medication:Gabapentin  300 MG  Has the patient contacted their pharmacy? Yes (Agent: If no, request that the patient contact the pharmacy for the refill. If patient does not wish to contact the pharmacy document the reason why and proceed with request.) (Agent: If yes, when and what did the pharmacy advise?)  This is the patient's preferred pharmacy:  [facility] [phone] - [place],  - [address] AT [facility] [address] [place] [address] Phone: [phone] Fax: [phone]  Is this the correct pharmacy for this prescription? Yes If no, delete pharmacy and type the correct one.   Has the prescription been filled recently? No  Is the patient out of the medication? Yes  Has the patient been seen for an appointment in the last year OR does the patient have an upcoming appointment? Yes  Can we respond through MyChart? No   [facility] called to follow up on previous refill request.

## 2023-10-06 ENCOUNTER — Ambulatory Visit: Admitting: Family Medicine

## 2023-10-06 ENCOUNTER — Encounter: Payer: Self-pay | Admitting: Family Medicine

## 2023-10-06 VITALS — BP 108/64 | Temp 98.2°F | Ht 63.0 in | Wt 122.5 lb

## 2023-10-06 DIAGNOSIS — H6121 Impacted cerumen, right ear: Secondary | ICD-10-CM | POA: Diagnosis not present

## 2023-10-06 NOTE — Patient Instructions (Signed)
 Follow up as needed or as scheduled To help dissolve wax, you can saturate a cotton ball w/ hydrogen peroxide and drip it into the ear Call with any questions or concerns Have a great weekend!

## 2023-10-06 NOTE — Progress Notes (Signed)
   Subjective:    Patient ID: Lindsay Ross, female    DOB: 1980-01-16, 43 y.o.   MRN: [id number]  HPI R ear fullness- showered Saturday night and felt like she got water in her ear.  Sunday woke up and everything was muffled on R side.  No relief w/ swimmers ear drops.     Review of Systems For ROS see HPI     Objective:   Physical Exam Vitals reviewed.  Constitutional:      General: She is not in acute distress.    Appearance: Normal appearance. She is not ill-appearing.  HENT:     Head: Normocephalic and atraumatic.     Right Ear: Tympanic membrane normal. There is impacted cerumen (pt consented to ear irrigation and wax was successfully removed w/ immediate relief of sxs.  pt tolerated w/o difficulty).     Left Ear: Tympanic membrane and ear canal normal. There is no impacted cerumen.  Skin:    General: Skin is warm and dry.  Neurological:     General: No focal deficit present.     Mental Status: She is alert and oriented to person, place, and time.  Psychiatric:        Mood and Affect: Mood normal.        Behavior: Behavior normal.        Thought Content: Thought content normal.           Assessment & Plan:  R hearing loss due to cerumen impaction- new.  Pt w/ soft wall of wax in R ear canal.  After consenting to irrigation, wax was successfully removed w/ immediate resolution of sxs.  Pt tolerated w/o difficulty and pleased w/ results.

## 2023-11-04 ENCOUNTER — Other Ambulatory Visit: Payer: Self-pay | Admitting: Family Medicine

## 2023-11-20 ENCOUNTER — Encounter: Payer: Self-pay | Admitting: Family Medicine

## 2023-11-21 MED ORDER — GABAPENTIN 300 MG PO CAPS: 300.0000 mg | ORAL_CAPSULE | Freq: Three times a day (TID) | ORAL | 3 refills | Status: DC

## 2023-12-04 ENCOUNTER — Other Ambulatory Visit: Payer: Self-pay

## 2023-12-04 DIAGNOSIS — J9811 Atelectasis: Secondary | ICD-10-CM

## 2023-12-04 MED ORDER — ALBUTEROL SULFATE HFA 108 (90 BASE) MCG/ACT IN AERS: 2.0000 | INHALATION_SPRAY | Freq: Four times a day (QID) | RESPIRATORY_TRACT | 1 refills | Status: AC | PRN

## 2024-01-18 ENCOUNTER — Other Ambulatory Visit: Payer: Self-pay | Admitting: Family Medicine

## 2024-01-18 DIAGNOSIS — N6310 Unspecified lump in the right breast, unspecified quadrant: Secondary | ICD-10-CM

## 2024-01-18 DIAGNOSIS — R928 Other abnormal and inconclusive findings on diagnostic imaging of breast: Secondary | ICD-10-CM

## 2024-01-19 ENCOUNTER — Telehealth: Payer: Self-pay | Admitting: Family Medicine

## 2024-01-19 NOTE — Telephone Encounter (Signed)
 Faxed paperwork and scanned to chart

## 2024-01-19 NOTE — Telephone Encounter (Signed)
 Form completed and returned to [place]

## 2024-01-19 NOTE — Telephone Encounter (Signed)
 Obtained document and placed in providers folder for review

## 2024-01-19 NOTE — Telephone Encounter (Signed)
 Type of form received: [facility]  Additional comments:   Received by: fax  Form should be Faxed/mailed to: (address/ fax #) [phone]  Is patient requesting call for pickup:  Form placed:  provider bin  Attach charge sheet.  Provider will determine charge.  Individual made aware of 3-5 business day turn around Yes?

## 2024-01-26 ENCOUNTER — Other Ambulatory Visit: Payer: Self-pay

## 2024-01-26 MED ORDER — GABAPENTIN 300 MG PO CAPS: 300.0000 mg | ORAL_CAPSULE | Freq: Three times a day (TID) | ORAL | 3 refills | Status: AC

## 2024-02-01 ENCOUNTER — Other Ambulatory Visit: Payer: Self-pay | Admitting: Family Medicine

## 2024-02-01 ENCOUNTER — Other Ambulatory Visit

## 2024-02-01 ENCOUNTER — Ambulatory Visit
Admission: RE | Admit: 2024-02-01 | Discharge: 2024-02-01 | Disposition: A | Discharge status: Home / Self Care | Source: Ambulatory Visit | Attending: Family Medicine | Admitting: Family Medicine

## 2024-02-01 DIAGNOSIS — R928 Other abnormal and inconclusive findings on diagnostic imaging of breast: Secondary | ICD-10-CM

## 2024-02-01 DIAGNOSIS — N6320 Unspecified lump in the left breast, unspecified quadrant: Secondary | ICD-10-CM
